# Patient Record
Sex: Female | Born: 2000 | Race: White | Hispanic: No | Smoking: Never smoker
Health system: Southern US, Community
[De-identification: ages and names within clinical notes are randomized; demographics above are authoritative.]

## PROBLEM LIST (undated history)

## (undated) DIAGNOSIS — K589 Irritable bowel syndrome without diarrhea: Secondary | ICD-10-CM

## (undated) DIAGNOSIS — F419 Anxiety disorder, unspecified: Secondary | ICD-10-CM

## (undated) DIAGNOSIS — F32A Depression, unspecified: Secondary | ICD-10-CM

## (undated) DIAGNOSIS — G43909 Migraine, unspecified, not intractable, without status migrainosus: Secondary | ICD-10-CM

## (undated) HISTORY — DX: Depression, unspecified: F32.A

## (undated) HISTORY — DX: Anxiety disorder, unspecified: F41.9

---
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
## 2022-04-25 IMAGING — CT CT HEAD W/O CM
3 series · 15 of 46 positions shown, 18 images · non-contrast
Comparison: None.

CLINICAL DATA: Hit head against car door

EXAM:
CT HEAD WITHOUT CONTRAST
TECHNIQUE: Contiguous axial images were obtained from the base of the skull
through the vertex without intravenous contrast.

[Series 2: head wo · axial · 0.45mm/px · z∈[-215,-95]mm · 9 of 29 slices shown, 12 images]
[im 3/29  brain]
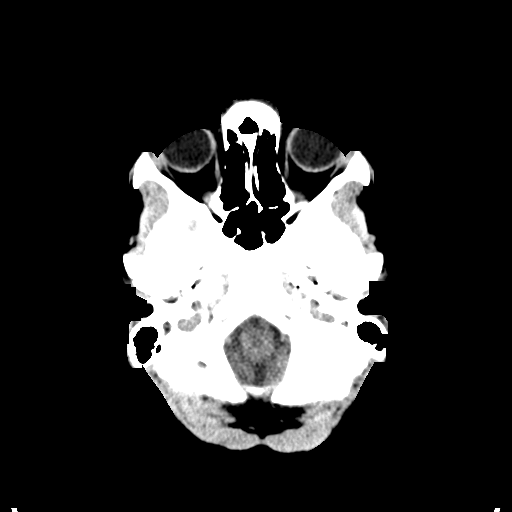
[im 3/29  bone]
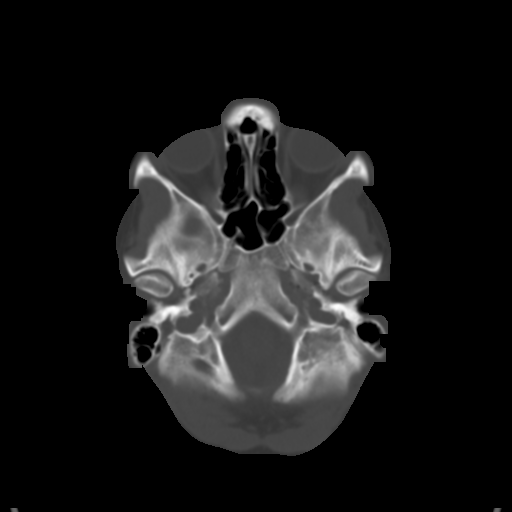
[im 6/29  brain]
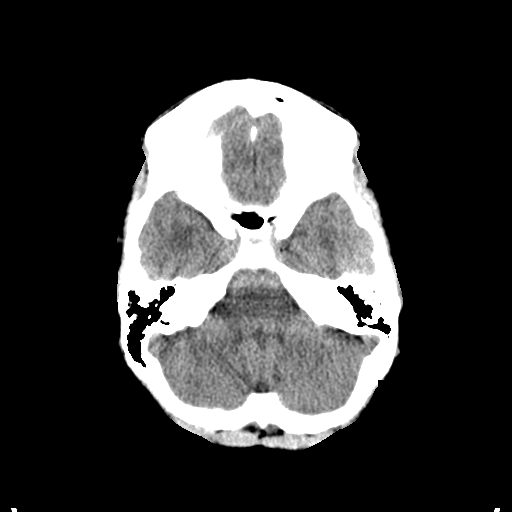
[im 9/29  brain]
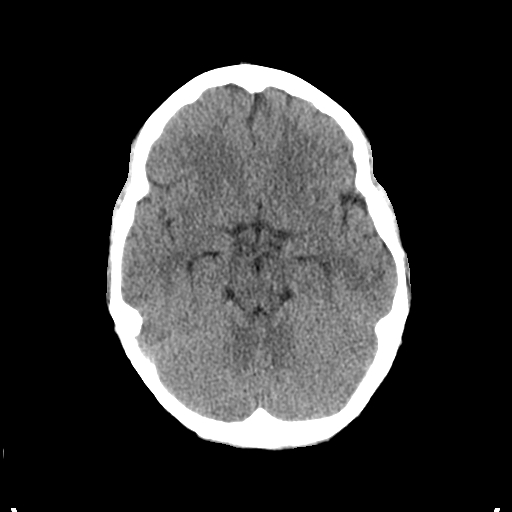
[im 12/29  brain]
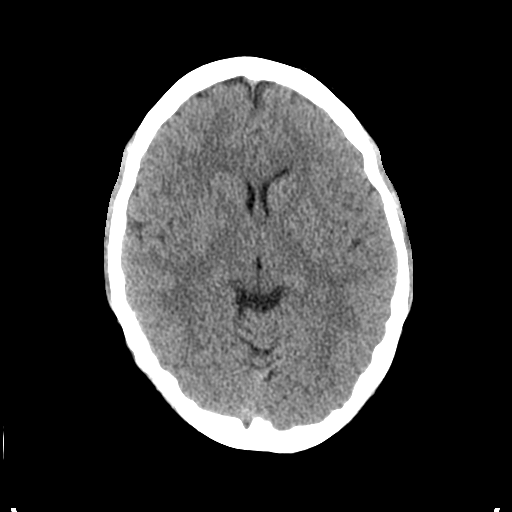
[im 15/29  brain]
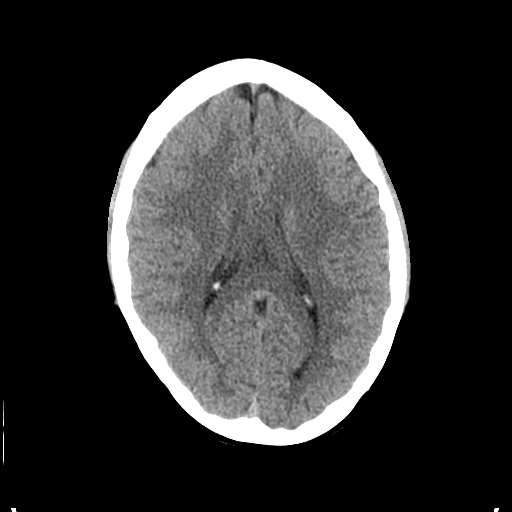
[im 15/29  bone]
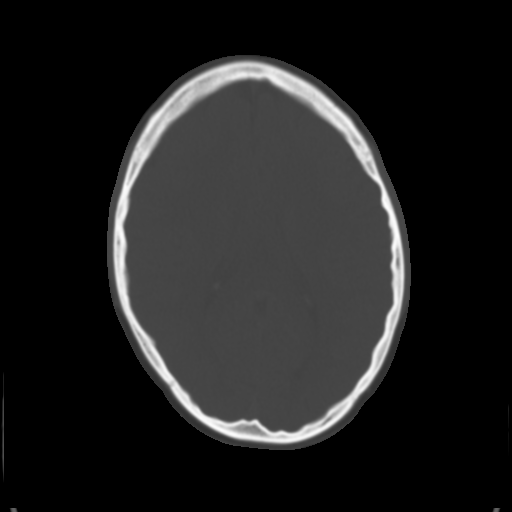
[im 18/29  brain]
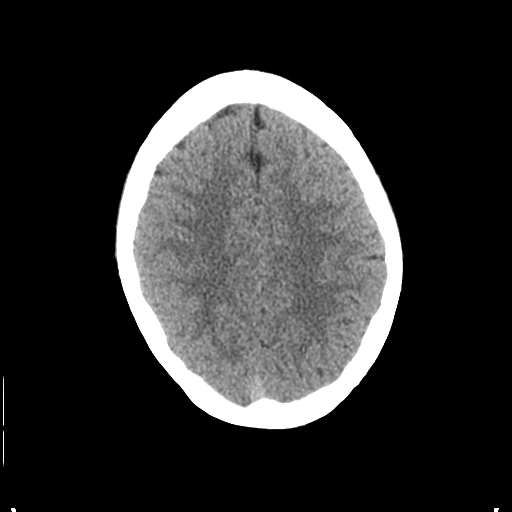
[im 21/29  brain]
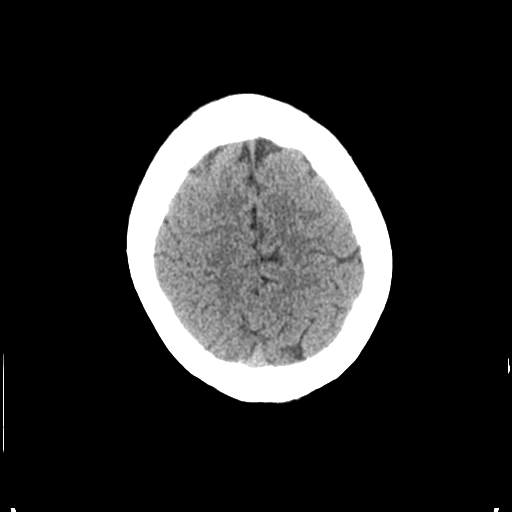
[im 24/29  brain]
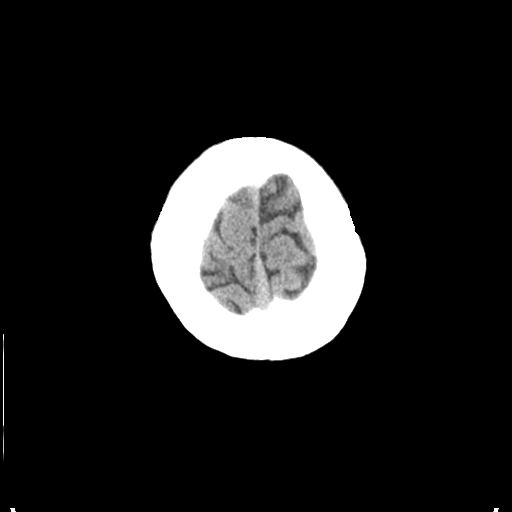
[im 27/29  brain]
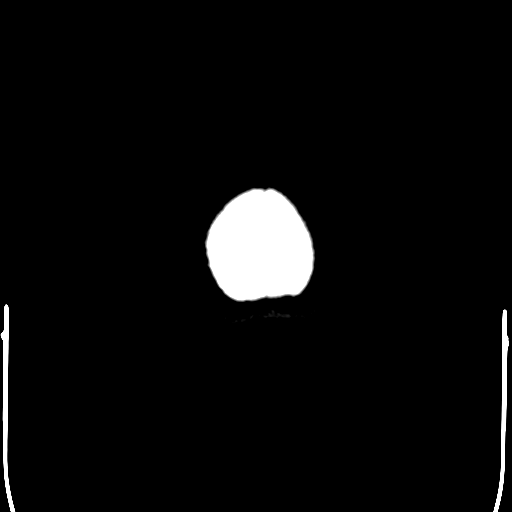
[im 27/29  bone]
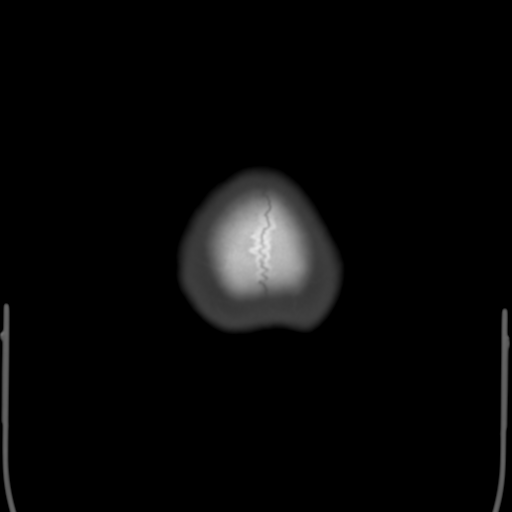

[Series 4: coronal soft tissue · coronal · 0.27mm/px · 3 of 65 slices shown]
[im 22/65  brain]
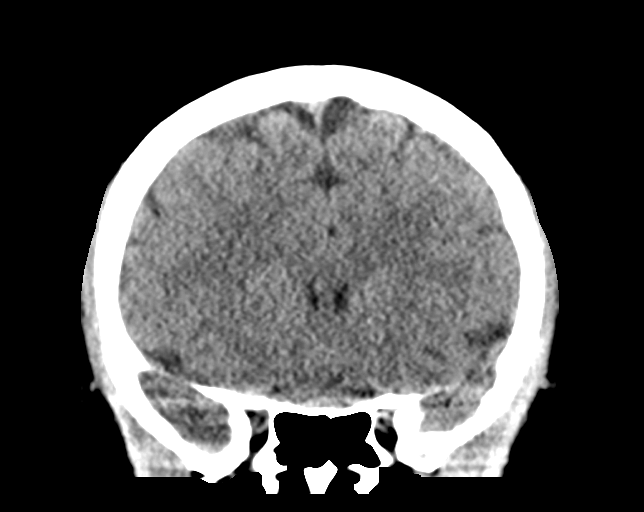
[im 29/65  brain]
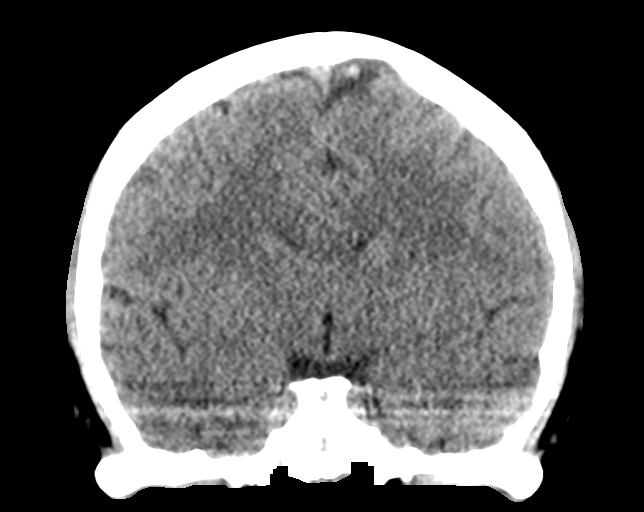
[im 36/65  brain]
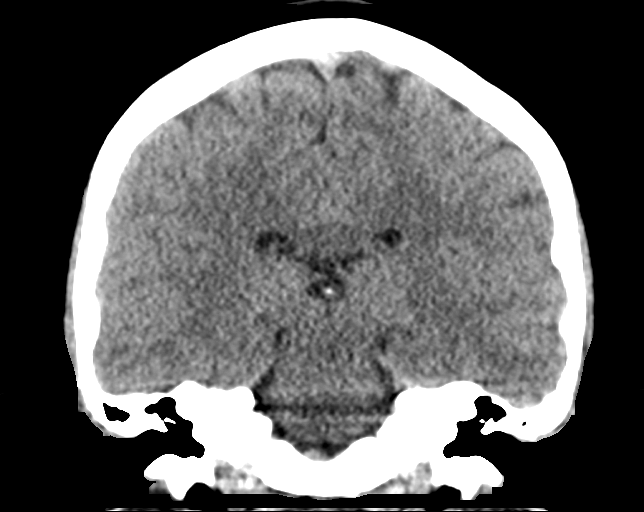

[Series 5: sagittal soft tissue · sagittal · 0.27mm/px · 3 of 58 slices shown]
[im 20/58  brain]
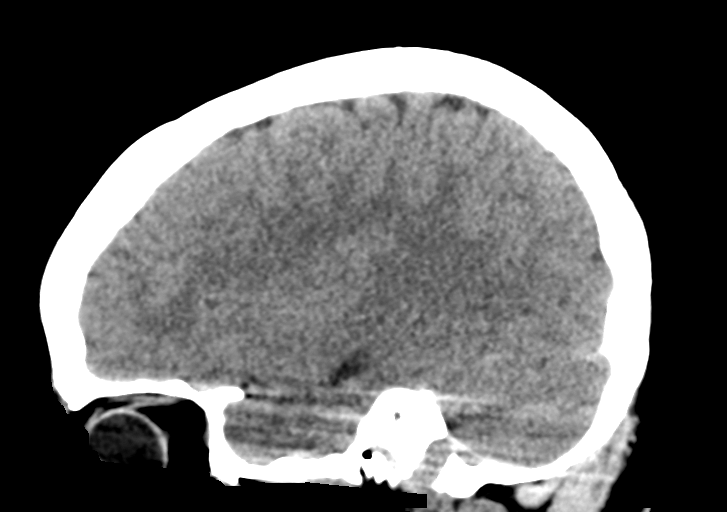
[im 29/58  brain]
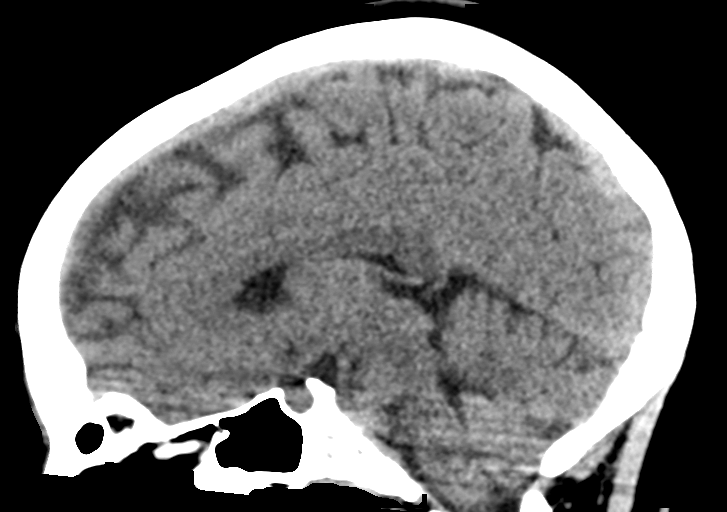
[im 39/58  brain]
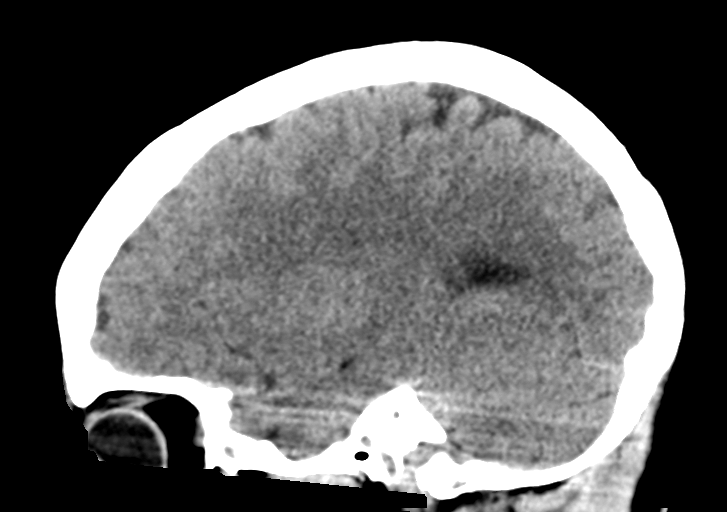

[15 of 46 positions shown; findings below may reference images not displayed]

FINDINGS: Brain: Ventricles and sulci are normal in size and configuration.
There is no intracranial mass, hemorrhage, extra-axial fluid
collection, or midline shift. Brain parenchyma appears unremarkable.
No acute infarct evident.

Vascular: No hyperdense vessel.  No evident vascular calcification.

Skull: Bony calvarium appears intact.

Sinuses/Orbits: Visualized paranasal sinuses are clear. Visualized
orbits appear symmetric bilaterally.

Other: Mastoid air cells are clear.
IMPRESSION: Study within normal limits.

## 2022-05-03 ENCOUNTER — Ambulatory Visit
Admission: EM | Admit: 2022-05-03 | Discharge: 2022-05-03 | Disposition: A | Discharge status: Home / Self Care | Payer: BC Managed Care – PPO

## 2022-05-03 ENCOUNTER — Encounter: Payer: Self-pay | Admitting: Emergency Medicine

## 2022-05-03 DIAGNOSIS — G5601 Carpal tunnel syndrome, right upper limb: Secondary | ICD-10-CM

## 2022-05-03 MED ORDER — KETOROLAC TROMETHAMINE 30 MG/ML IJ SOLN
30.0000 mg | Freq: Once | INTRAMUSCULAR | Status: AC
  Administered 2022-05-03: 30 mg via INTRAMUSCULAR

## 2022-05-03 MED ORDER — CYCLOBENZAPRINE HCL 10 MG PO TABS: 10.0000 mg | ORAL_TABLET | Freq: Every day | ORAL | 0 refills | Status: DC

## 2022-05-03 MED ORDER — PREDNISONE 20 MG PO TABS: 40.0000 mg | ORAL_TABLET | Freq: Every day | ORAL | 0 refills | Status: DC

## 2022-07-01 ENCOUNTER — Ambulatory Visit
Admission: EM | Admit: 2022-07-01 | Discharge: 2022-07-01 | Disposition: A | Discharge status: Home / Self Care | Payer: Medicaid Other | Attending: Emergency Medicine | Admitting: Emergency Medicine

## 2022-07-01 DIAGNOSIS — R300 Dysuria: Secondary | ICD-10-CM | POA: Diagnosis not present

## 2022-07-01 DIAGNOSIS — J029 Acute pharyngitis, unspecified: Secondary | ICD-10-CM | POA: Diagnosis not present

## 2022-07-01 DIAGNOSIS — R1030 Lower abdominal pain, unspecified: Secondary | ICD-10-CM | POA: Insufficient documentation

## 2022-07-01 DIAGNOSIS — B349 Viral infection, unspecified: Secondary | ICD-10-CM | POA: Insufficient documentation

## 2022-07-01 DIAGNOSIS — R197 Diarrhea, unspecified: Secondary | ICD-10-CM | POA: Diagnosis not present

## 2022-07-01 DIAGNOSIS — Z20822 Contact with and (suspected) exposure to covid-19: Secondary | ICD-10-CM | POA: Insufficient documentation

## 2022-07-01 DIAGNOSIS — Z79899 Other long term (current) drug therapy: Secondary | ICD-10-CM | POA: Insufficient documentation

## 2022-07-01 LAB — POCT URINALYSIS DIP (MANUAL ENTRY)
Bilirubin, UA: NEGATIVE
Blood, UA: NEGATIVE
Glucose, UA: NEGATIVE mg/dL
Ketones, POC UA: NEGATIVE mg/dL
Nitrite, UA: NEGATIVE
Protein Ur, POC: NEGATIVE mg/dL
Spec Grav, UA: 1.02 (ref 1.010–1.025)
Urobilinogen, UA: 0.2 E.U./dL
pH, UA: 8 (ref 5.0–8.0)

## 2022-07-01 LAB — POCT RAPID STREP A (OFFICE): Rapid Strep A Screen: NEGATIVE

## 2022-07-01 LAB — POCT URINE PREGNANCY: Preg Test, Ur: NEGATIVE

## 2022-07-01 MED ORDER — LIDOCAINE VISCOUS HCL 2 % MT SOLN: 15.0000 mL | OROMUCOSAL | 0 refills | Status: DC | PRN

## 2022-07-01 NOTE — ED Provider Notes (Signed)
Andrea Mccarthy    CSN: [id number] Arrival date & time: 07/01/22  1204      History   Chief Complaint Chief Complaint  Patient presents with   Sore Throat    HPI Andrea Mccarthy is a 21 y.o. female.  Patient presents with 1 day history of chills, sore throat, congestion, cough, diarrhea.  She also reports dysuria and lower abdominal cramping.  No diarrhea today.  She denies fever, rash, chest pain, shortness of breath, vomiting, vaginal discharge, hematuria, pelvic pain, or other symptoms.  No OTC medications taken today.  Her medical history includes migraine headaches, IBS, depression, anxiety, gender dysphoria.  The history is provided by the patient and medical records.    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 No pertinent surgical history.  OB History   No obstetric history on file.      Home Medications    Prior to Admission medications   Medication Sig Start Date End Date Taking? Authorizing Provider  lidocaine (XYLOCAINE) 2 % solution Use as directed 15 mLs in the mouth or throat as needed for mouth pain. 07/01/22  Yes Mickie Bail, NP  amitriptyline (ELAVIL) 50 MG tablet Take by mouth. 03/13/20 03/13/21  [provider]  cyclobenzaprine (FLEXERIL) 10 MG tablet Take 1 tablet (10 mg total) by mouth at bedtime. 05/03/22   Valinda Hoar, NP  famotidine (PEPCID) 40 MG tablet Take 1 tablet (40 mg total) by mouth daily for 10 days. 10/04/20 10/14/20  Orma Flaming, NP  hydrOXYzine (ATARAX/VISTARIL) 50 MG tablet Take 1 tablet (50 mg total) by mouth at bedtime. 09/23/19   Darcel Smalling, MD  levonorgestrel (MIRENA) 20 MCG/DAY IUD 1 each by Intrauterine  route once.    [provider]  ondansetron (ZOFRAN) 8 MG tablet Take 1 tablet (8 mg total) by mouth every 8 (eight) hours as needed for nausea or vomiting. 12/28/20   Joni Reining, PA-C  predniSONE (DELTASONE) 20 MG tablet Take 2 tablets (40 mg total) by mouth daily. 05/03/22   Valinda Hoar, NP  sertraline (ZOLOFT) 50 MG tablet Take 1.5 tablets (75 mg total) by mouth daily. 09/23/19 11/22/19  Darcel Smalling, MD  SUMAtriptan (IMITREX) 50 MG tablet Take by mouth. 10/27/19 10/26/20  [provider]  traZODone (DESYREL) 50 MG tablet Take 0.5-1 tablets (25-50 mg total) by mouth at bedtime as needed for sleep. 09/23/19   Darcel Smalling, MD    Family History Family History  Family history unknown: Yes    Social History Social History   Tobacco Use   Smoking status: Never   Smokeless tobacco: Never  Vaping Use   Vaping Use: Never used  Substance Use Topics   Alcohol use: No   Drug use: Never     Allergies   Patient has no known allergies.   Review of Systems Review of Systems  Constitutional:  Positive for chills. Negative for fever.  HENT:  Positive for congestion and sore throat. Negative for ear pain.   Respiratory:  Positive for cough. Negative for shortness of breath.   Cardiovascular:  Negative for chest pain and palpitations.  Gastrointestinal:  Positive for abdominal  pain and diarrhea. Negative for vomiting.  Genitourinary:  Positive for dysuria. Negative for flank pain, hematuria, pelvic pain and vaginal discharge.  Skin:  Negative for color change and rash.  All other systems reviewed and are negative.    Physical Exam Triage Vital Signs ED Triage Vitals  Enc Vitals Group     BP 07/01/22 1235 123/80     Pulse Rate 07/01/22 1235 (!) 104     Resp 07/01/22 1235 18     Temp 07/01/22 1235 98.4 F (36.9 C)     Temp src --      SpO2 07/01/22 1235 97 %     Weight 07/01/22 1239 127 lb (57.6 kg)     Height 07/01/22 1239 4\' 11"  (1.499 m)      Head Circumference --      Peak Flow --      Pain Score 07/01/22 1239 8     Pain Loc --      Pain Edu? --      Excl. in GC? --    No data found.  Updated Vital Signs BP 123/80   Pulse (!) 104   Temp 98.4 F (36.9 C)   Resp 18   Ht 4\' 11"  (1.499 m)   Wt 127 lb (57.6 kg)   SpO2 97%   BMI 25.65 kg/m   Visual Acuity Right Eye Distance:   Left Eye Distance:   Bilateral Distance:    Right Eye Near:   Left Eye Near:    Bilateral Near:     Physical Exam Vitals and nursing note reviewed.  Constitutional:      General: She is not in acute distress.    Appearance: Normal appearance. She is well-developed. She is not ill-appearing.  HENT:     Right Ear: Tympanic membrane normal.     Left Ear: Tympanic membrane normal.     Nose: Nose normal.     Mouth/Throat:     Mouth: Mucous membranes are moist.     Pharynx: Oropharynx is clear.  Cardiovascular:     Rate and Rhythm: Normal rate and regular rhythm.     Heart sounds: Normal heart sounds.  Pulmonary:     Effort: Pulmonary effort is normal. No respiratory distress.     Breath sounds: Normal breath sounds.  Abdominal:     General: Bowel sounds are normal.     Palpations: Abdomen is soft.     Tenderness: There is no abdominal tenderness. There is no right CVA tenderness, left CVA tenderness, guarding or rebound.  Musculoskeletal:     Cervical back: Neck supple.  Skin:    General: Skin is warm and dry.  Neurological:     Mental Status: She is alert.  Psychiatric:        Mood and Affect: Mood normal.        Behavior: Behavior normal.      UC Treatments / Results  Labs (all labs ordered are listed, but only abnormal results are displayed) Labs Reviewed  POCT URINALYSIS DIP (MANUAL ENTRY) - Abnormal; Notable for the following components:      Result Value   Clarity, UA cloudy (*)    Leukocytes, UA Trace (*)    All other components within normal limits  URINE CULTURE  SARS CORONAVIRUS 2 (TAT 6-24 HRS)  POCT RAPID  STREP A (OFFICE)  POCT URINE PREGNANCY    EKG   Radiology No results found.  Procedures Procedures (including critical care time)  Medications Ordered  in UC Medications - No data to display  Initial Impression / Assessment and Plan / UC Course  I have reviewed the triage vital signs and the nursing notes.  Pertinent labs & imaging results that were available during my care of the patient were reviewed by me and considered in my medical decision making (see chart for details).    Dysuria, viral illness, sore throat, diarrhea, lower abdominal pain.  Rapid strep negative.  COVID pending.  Urine culture pending.  Treating sore throat with viscous lidocaine.  Discussed other symptomatic treatment including Tylenol or ibuprofen as needed.  Instructed patient to follow-up with her PCP if her symptoms are not improving.  ED precautions discussed.  Education provided on dysuria, viral illness, sore throat, diarrhea, abdominal pain.  Patient agrees to plan of care.  Final Clinical Impressions(s) / UC Diagnoses   Final diagnoses:  Dysuria  Viral illness  Sore throat  Diarrhea, unspecified type  Lower abdominal pain     Discharge Instructions      The strep test is negative.  COVID is pending.    Use the viscous lidocaine as directed.  Take Tylenol or ibuprofen as directed.   Urine culture is pending.    Follow up with your primary care provider if your symptoms are not improving.        ED Prescriptions     Medication Sig Dispense Auth. Provider   lidocaine (XYLOCAINE) 2 % solution Use as directed 15 mLs in the mouth or throat as needed for mouth pain. 100 mL Mickie Bail, NP      PDMP not reviewed this encounter.   Mickie Bail, NP 07/01/22 [phone]

## 2022-07-01 NOTE — Discharge Instructions (Addendum)
The strep test is negative.  COVID is pending.    Use the viscous lidocaine as directed.  Take Tylenol or ibuprofen as directed.   Urine culture is pending.    Follow up with your primary care provider if your symptoms are not improving.

## 2022-07-01 NOTE — ED Triage Notes (Addendum)
Patient to Urgent Care with complaints of sore throat and congestion x1 day. Also reports lower abdominal cramping/ sharp pains. Denies NV but endorses some diarrhea. Some burning with urination.   Denies any known sick contacts but does work at [employer].

## 2022-07-02 LAB — URINE CULTURE

## 2022-07-02 LAB — SARS CORONAVIRUS 2 (TAT 6-24 HRS): SARS Coronavirus 2: NEGATIVE

## 2022-07-04 DIAGNOSIS — Z719 Counseling, unspecified: Secondary | ICD-10-CM | POA: Diagnosis not present

## 2022-07-04 DIAGNOSIS — F418 Other specified anxiety disorders: Secondary | ICD-10-CM | POA: Diagnosis not present

## 2022-07-04 DIAGNOSIS — R197 Diarrhea, unspecified: Secondary | ICD-10-CM | POA: Diagnosis not present

## 2022-07-04 DIAGNOSIS — J069 Acute upper respiratory infection, unspecified: Secondary | ICD-10-CM | POA: Diagnosis not present

## 2022-12-03 ENCOUNTER — Inpatient Hospital Stay (HOSPITAL_COMMUNITY)
Admission: AD | Admit: 2022-12-03 | Discharge: 2022-12-10 | DRG: 885 | Disposition: A | Discharge status: Home / Self Care | Payer: Medicaid Other | Source: Intra-hospital | Attending: Psychiatry | Admitting: Psychiatry

## 2022-12-03 ENCOUNTER — Ambulatory Visit (HOSPITAL_COMMUNITY)
Admission: EM | Admit: 2022-12-03 | Discharge: 2022-12-03 | Disposition: A | Discharge status: Other Acute Inpatient Hospital | Payer: Medicaid Other | Attending: Psychiatry | Admitting: Psychiatry

## 2022-12-03 DIAGNOSIS — K589 Irritable bowel syndrome without diarrhea: Secondary | ICD-10-CM | POA: Diagnosis present

## 2022-12-03 DIAGNOSIS — Z1152 Encounter for screening for COVID-19: Secondary | ICD-10-CM | POA: Diagnosis not present

## 2022-12-03 DIAGNOSIS — F22 Delusional disorders: Secondary | ICD-10-CM | POA: Insufficient documentation

## 2022-12-03 DIAGNOSIS — R42 Dizziness and giddiness: Secondary | ICD-10-CM | POA: Diagnosis not present

## 2022-12-03 DIAGNOSIS — Z6372 Alcoholism and drug addiction in family: Secondary | ICD-10-CM

## 2022-12-03 DIAGNOSIS — Z9151 Personal history of suicidal behavior: Secondary | ICD-10-CM | POA: Diagnosis not present

## 2022-12-03 DIAGNOSIS — F332 Major depressive disorder, recurrent severe without psychotic features: Secondary | ICD-10-CM | POA: Diagnosis not present

## 2022-12-03 DIAGNOSIS — Z9152 Personal history of nonsuicidal self-harm: Secondary | ICD-10-CM

## 2022-12-03 DIAGNOSIS — F419 Anxiety disorder, unspecified: Secondary | ICD-10-CM | POA: Diagnosis present

## 2022-12-03 DIAGNOSIS — Z79899 Other long term (current) drug therapy: Secondary | ICD-10-CM

## 2022-12-03 DIAGNOSIS — W1830XA Fall on same level, unspecified, initial encounter: Secondary | ICD-10-CM | POA: Diagnosis not present

## 2022-12-03 DIAGNOSIS — I959 Hypotension, unspecified: Secondary | ICD-10-CM | POA: Diagnosis not present

## 2022-12-03 DIAGNOSIS — Z62810 Personal history of physical and sexual abuse in childhood: Secondary | ICD-10-CM

## 2022-12-03 DIAGNOSIS — K5900 Constipation, unspecified: Secondary | ICD-10-CM | POA: Diagnosis present

## 2022-12-03 DIAGNOSIS — Y92239 Unspecified place in hospital as the place of occurrence of the external cause: Secondary | ICD-10-CM | POA: Diagnosis not present

## 2022-12-03 DIAGNOSIS — F515 Nightmare disorder: Secondary | ICD-10-CM | POA: Diagnosis present

## 2022-12-03 DIAGNOSIS — Z811 Family history of alcohol abuse and dependence: Secondary | ICD-10-CM

## 2022-12-03 DIAGNOSIS — R45851 Suicidal ideations: Secondary | ICD-10-CM

## 2022-12-03 LAB — COMPREHENSIVE METABOLIC PANEL
ALT: 18 U/L (ref 0–44)
AST: 21 U/L (ref 15–41)
Albumin: 4.4 g/dL (ref 3.5–5.0)
Alkaline Phosphatase: 59 U/L (ref 38–126)
Anion gap: 10 (ref 5–15)
BUN: 6 mg/dL (ref 6–20)
CO2: 26 mmol/L (ref 22–32)
Calcium: 9.5 mg/dL (ref 8.9–10.3)
Chloride: 103 mmol/L (ref 98–111)
Creatinine, Ser: 0.75 mg/dL (ref 0.44–1.00)
GFR, Estimated: >60 mL/min (ref >60)
Glucose, Bld: 74 mg/dL (ref 70–99)
Potassium: 3.7 mmol/L (ref 3.5–5.1)
Sodium: 139 mmol/L (ref 135–145)
Total Bilirubin: 0.5 mg/dL (ref 0.3–1.2)
Total Protein: 7.5 g/dL (ref 6.5–8.1)

## 2022-12-03 LAB — URINALYSIS, COMPLETE (UACMP) WITH MICROSCOPIC
Bilirubin Urine: NEGATIVE
Glucose, UA: NEGATIVE mg/dL
Hgb urine dipstick: NEGATIVE
Ketones, ur: NEGATIVE mg/dL
Leukocytes,Ua: NEGATIVE
Nitrite: NEGATIVE
Protein, ur: NEGATIVE mg/dL
Specific Gravity, Urine: 1.02 (ref 1.005–1.030)
pH: 6 (ref 5.0–8.0)

## 2022-12-03 LAB — LIPID PANEL
Cholesterol: 168 mg/dL (ref 0–200)
HDL: 66 mg/dL (ref >40)
LDL Cholesterol: 95 mg/dL (ref 0–99)
Total CHOL/HDL Ratio: 2.5 RATIO
Triglycerides: 33 mg/dL (ref <150)
VLDL: 7 mg/dL (ref 0–40)

## 2022-12-03 LAB — CBC WITH DIFFERENTIAL/PLATELET
Abs Immature Granulocytes: 0.01 10*3/uL (ref 0.00–0.07)
Basophils Absolute: 0 10*3/uL (ref 0.0–0.1)
Basophils Relative: 1 %
Eosinophils Absolute: 0 10*3/uL (ref 0.0–0.5)
Eosinophils Relative: 1 %
HCT: 39.3 % (ref 36.0–46.0)
Hemoglobin: 13.5 g/dL (ref 12.0–15.0)
Immature Granulocytes: 0 %
Lymphocytes Relative: 25 %
Lymphs Abs: 1.6 10*3/uL (ref 0.7–4.0)
MCH: 31.5 pg (ref 26.0–34.0)
MCHC: 34.4 g/dL (ref 30.0–36.0)
MCV: 91.6 fL (ref 80.0–100.0)
Monocytes Absolute: 0.4 10*3/uL (ref 0.1–1.0)
Monocytes Relative: 7 %
Neutro Abs: 4.3 10*3/uL (ref 1.7–7.7)
Neutrophils Relative %: 66 %
Platelets: 308 10*3/uL (ref 150–400)
RBC: 4.29 MIL/uL (ref 3.87–5.11)
RDW: 12.7 % (ref 11.5–15.5)
WBC: 6.5 10*3/uL (ref 4.0–10.5)
nRBC: 0 % (ref 0.0–0.2)

## 2022-12-03 LAB — POC SARS CORONAVIRUS 2 AG: SARSCOV2ONAVIRUS 2 AG: NEGATIVE

## 2022-12-03 LAB — POCT URINE DRUG SCREEN - MANUAL ENTRY (I-SCREEN)
POC Amphetamine UR: NOT DETECTED
POC Buprenorphine (BUP): NOT DETECTED
POC Cocaine UR: NOT DETECTED
POC Marijuana UR: NOT DETECTED
POC Methadone UR: NOT DETECTED
POC Methamphetamine UR: NOT DETECTED
POC Morphine: NOT DETECTED
POC Oxazepam (BZO): NOT DETECTED
POC Oxycodone UR: NOT DETECTED
POC Secobarbital (BAR): NOT DETECTED

## 2022-12-03 LAB — RESP PANEL BY RT-PCR (RSV, FLU A&B, COVID)  RVPGX2
Influenza A by PCR: NEGATIVE
Influenza B by PCR: NEGATIVE
Resp Syncytial Virus by PCR: NEGATIVE
SARS Coronavirus 2 by RT PCR: NEGATIVE

## 2022-12-03 LAB — ETHANOL: Alcohol, Ethyl (B): <10 mg/dL (ref <10)

## 2022-12-03 LAB — HEMOGLOBIN A1C
Hgb A1c MFr Bld: 4.9 % (ref 4.8–5.6)
Mean Plasma Glucose: 93.93 mg/dL

## 2022-12-03 LAB — MAGNESIUM: Magnesium: 2.1 mg/dL (ref 1.7–2.4)

## 2022-12-03 LAB — POC URINE PREG, ED: Preg Test, Ur: NEGATIVE

## 2022-12-03 LAB — TSH: TSH: 1.283 u[IU]/mL (ref 0.350–4.500)

## 2022-12-03 MED ORDER — SERTRALINE HCL 50 MG PO TABS
50.0000 mg | ORAL_TABLET | Freq: Every day | ORAL | Status: DC
  Administered 2022-12-04: 50 mg via ORAL
  Filled 2022-12-03 (×4): qty 1

## 2022-12-03 MED ORDER — MAGNESIUM HYDROXIDE 400 MG/5ML PO SUSP: 30.0000 mL | Freq: Every day | ORAL | Status: DC | PRN

## 2022-12-03 MED ORDER — ALUM & MAG HYDROXIDE-SIMETH 200-200-20 MG/5ML PO SUSP: 30.0000 mL | ORAL | Status: DC | PRN

## 2022-12-03 MED ORDER — HYDROXYZINE HCL 25 MG PO TABS: 25.0000 mg | ORAL_TABLET | Freq: Three times a day (TID) | ORAL | 0 refills | Status: DC | PRN

## 2022-12-03 MED ORDER — HYDROXYZINE HCL 25 MG PO TABS
25.0000 mg | ORAL_TABLET | Freq: Three times a day (TID) | ORAL | Status: DC | PRN
  Filled 2022-12-03: qty 1

## 2022-12-03 MED ORDER — DIPHENHYDRAMINE HCL 25 MG PO CAPS
25.0000 mg | ORAL_CAPSULE | Freq: Once | ORAL | Status: DC
  Filled 2022-12-03: qty 1

## 2022-12-03 MED ORDER — HYDROXYZINE HCL 25 MG PO TABS: 25.0000 mg | ORAL_TABLET | Freq: Three times a day (TID) | ORAL | Status: DC | PRN

## 2022-12-03 MED ORDER — TRAZODONE HCL 50 MG PO TABS: 50.0000 mg | ORAL_TABLET | Freq: Every evening | ORAL | Status: DC | PRN

## 2022-12-03 MED ORDER — SERTRALINE HCL 50 MG PO TABS: 50.0000 mg | ORAL_TABLET | Freq: Every day | ORAL | Status: DC

## 2022-12-03 MED ORDER — ACETAMINOPHEN 325 MG PO TABS
650.0000 mg | ORAL_TABLET | Freq: Four times a day (QID) | ORAL | Status: DC | PRN
  Administered 2022-12-03: 650 mg via ORAL
  Filled 2022-12-03: qty 2

## 2022-12-03 MED ORDER — ACETAMINOPHEN 325 MG PO TABS: 650.0000 mg | ORAL_TABLET | Freq: Four times a day (QID) | ORAL | Status: DC | PRN

## 2022-12-03 MED ORDER — MAGNESIUM HYDROXIDE 400 MG/5ML PO SUSP
30.0000 mL | Freq: Every day | ORAL | Status: DC | PRN
  Administered 2022-12-08: 30 mL via ORAL
  Filled 2022-12-03: qty 30

## 2022-12-03 MED ORDER — SERTRALINE HCL 50 MG PO TABS
50.0000 mg | ORAL_TABLET | Freq: Every day | ORAL | Status: DC
  Administered 2022-12-03: 50 mg via ORAL
  Filled 2022-12-03: qty 1

## 2022-12-03 NOTE — ED Notes (Signed)
[facility] lab courier called to transport labs to [facility] lab 

## 2022-12-03 NOTE — Progress Notes (Signed)
Pt was accepted to [facility] 12/03/22; Bed Assignment 301-2 PENDING Labs, COVID, and Voluntary Consent faxed to [phone]  Pt meets inpatient criteria per Thomes Lolling, NP  Attending Physician will be Dr. Caswell Corwin  Report can be called to: Adult unit: [phone]  Pt can arrive after: PENDING ITEMS  Care Team notified: [facility] Lynnda Shields, RN, Thomes Lolling, NP, Mardene Sayer, RN, Drema Halon, RN,  [facility] Wynonia Hazard, RN, [name], [place], [place] 12/03/2022 @ 6:09 PM   ,

## 2022-12-03 NOTE — ED Notes (Addendum)
Pt on obs until covid test results then being transported to [facility] . endorsing SI denies HI/AVH. Calm, cooperative throughout interview process. Skin assessment completed. Oriented to unit. Meal and drink offered. At [place], pt continue to endorse SI denies HI/AVH. Pt verbally contract for safety. Will monitor for safety.Patient belongs are in locker 88

## 2022-12-03 NOTE — ED Notes (Addendum)
Patient  sleeping in no acute stress. RR even and unlabored .Environment secured .Will continue to monitor for safely. 
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
## 2022-12-03 NOTE — ED Notes (Signed)
Pt resting at this hour. Easy to arouse. Pt endorses suicidal thoughts. Her plan was to OD on medications. "My friend has disposed of all the pills I had." Pt contracts for safety. Pt endorses high anxiety and depression. Pt being transferred to [facility] for treatment tonight. No apparent distress. RR even and unlabored. Monitored for safety.

## 2022-12-03 NOTE — ED Notes (Signed)
Called report to [employer] @ [phone]. States to have Next rn call covid results once they are back.

## 2022-12-03 NOTE — ED Provider Notes (Signed)
Behavioral Health Urgent Care Medical Screening Exam  Patient Name: Andrea Mccarthy MRN: [id number] Date of Evaluation: 12/03/22 Chief Complaint:  "I tried to kill myself twice this week". Diagnosis:  Final diagnoses:  Severe episode of recurrent major depressive disorder, without psychotic features ([place])  Suicidal ideation    History of Present illness: Andrea Mccarthy is a 22 y.o. female patient presented to [facility] as a walk in with her friend with complaints of, "I tried to kill myself twice this week".    Andrea Mccarthy, 22 y.o., female patient seen face to face by this provider and chart reviewed on 12/03/22.  Per chart review patient has past psychiatric history of MDD.  She currently does not take any medications and has no psychiatric services in place.  She does not remember any of the medication she has taken in the past but per chart review she has taken Elavil and Zoloft.  She denies any previous inpatient psychiatric admissions.  She denies any substance use.   On evaluation Andrea Mccarthy reports she has been depressed on and off for years.  However over the past few months she has noticed an increase in her depression and anxiety but within the last 2 weeks she feels as though it is become intense. Over the past week she has began having intrusive suicidal thoughts.   She had 2 suicide attempts this week by trying to cut herself.  She presents today stating, "I know that I need help and I am ready to get it".  Cannot identify any recent stressors/triggers.  During evaluation Andrea Mccarthy is observed sitting in the assessment room in no acute distress.  She is disheveled and makes fair eye contact.  She is alert/oriented x 4, cooperative, and attentive.  Her speech is clear, coherent, at a normal rate and tone.  She endorses an increase in anxiety and depression feelings of decreased concentration, fatigue, hopelessness, decrease in appetite, tearfulness.  She has a  dysphoric affect.  She reports her sleep is fair.  She is endorsing suicidal ideations with a plan to overdose.  She cannot contract for safety.  She denies HI/AVH.  She endorses some paranoia in social settings.  She does not appear manic or psychotic.  Objectively she does not appear to be responding to internal/external stimuli.  She is able to converse coherently with goal-directed thoughts and no distractibility.  Discussed inpatient psychiatric admission with patient and she is agreeable.  Discussed starting Zoloft 50 mg daily for depression/anxiety.  Educated on medication including adverse reactions/side effects.  Patient is agreeable  [place] ED from 12/03/2022 in [facility] ED from 07/01/2022 in [facility] at [facility]  ED from 05/03/2022 in [facility]gent [facility] at [place] Error: Question 6 not populated No Risk No Risk       Psychiatric Specialty Exam  Presentation  General Appearance:Disheveled  Eye Contact:Fair  Speech:Clear and Coherent; Normal Rate  Speech Volume:Normal  Handedness:Right   Mood and Affect  Mood:Anxious; Depressed  Affect:Congruent   Thought Process  Thought Processes:Coherent  Descriptions of Associations:Intact  Orientation:Full (Time, Place and Person)  Thought Content:Logical  Diagnosis of Schizophrenia or Schizoaffective disorder in past: No   Hallucinations:None  Ideas of Reference:None  Suicidal Thoughts:Yes, Active With Intent; With Plan; With Means to Carry Out  Homicidal Thoughts:No   Sensorium  Memory:Immediate Good; Recent Good; Remote Good  Judgment:Fair  Insight:Fair   Executive  Functions  Concentration:Good  Attention Span:Good  [place]  Language:Good   Psychomotor Activity  Psychomotor Activity:Normal   Assets  Assets:Communication Skills; Desire for Improvement; Financial Resources/Insurance;  Housing; Physical Health; Resilience; Social Support   Sleep  Sleep:Fair  Number of hours: No data recorded  Physical Exam: Physical Exam Vitals and nursing note reviewed.  Constitutional:      General: She is not in acute distress.    Appearance: Normal appearance.  HENT:     Head: Normocephalic.  Eyes:     General:        Right eye: No discharge.        Left eye: No discharge.     Conjunctiva/sclera: Conjunctivae normal.  Cardiovascular:     Rate and Rhythm: Normal rate.  Pulmonary:     Effort: Pulmonary effort is normal. No respiratory distress.  Musculoskeletal:        General: Normal range of motion.     Cervical back: Normal range of motion.  Skin:    Coloration: Skin is not jaundiced or pale.  Neurological:     Mental Status: She is alert and oriented to person, place, and time.  Psychiatric:        Attention and Perception: Attention and perception normal.        Mood and Affect: Affect normal. Mood is anxious and depressed.        Speech: Speech normal.        Behavior: Behavior normal. Behavior is cooperative.        Thought Content: Thought content includes suicidal ideation. Thought content includes suicidal plan.        Cognition and Memory: Cognition normal.        Judgment: Judgment is impulsive.    Review of Systems  Constitutional: Negative.   HENT: Negative.    Eyes: Negative.   Respiratory: Negative.    Cardiovascular: Negative.   Musculoskeletal: Negative.   Skin: Negative.   Neurological: Negative.   Psychiatric/Behavioral:  Positive for depression and suicidal ideas. The patient is nervous/anxious.    Blood pressure 135/81, pulse 93, temperature 98.2 F (36.8 C), temperature source Oral, resp. rate 18, SpO2 100 %. There is no height or weight on file to calculate BMI.  Musculoskeletal: Strength & Muscle Tone: within normal limits Gait & Station: normal Patient leans: N/A   [place] MSE Discharge Disposition for Follow up and  Recommendations: Based on my evaluation I certify that psychiatric inpatient services furnished can reasonably be expected to improve the patient's condition which I recommend transfer to an appropriate accepting facility.   Patient meets criteria for inpatient psychiatric admission.  [facility] [place] [facility] notified and patient has been accepted.  She can arrive 12/03/2022.  Zoloft 50 mg daily initiated for depression/anxiety  Lab Orders         Resp panel by RT-PCR (RSV, Flu A&B, Covid) Anterior Nasal Swab         CBC with Differential/Platelet         Comprehensive metabolic panel         Hemoglobin A1c         Magnesium         Ethanol         Lipid panel         TSH         RPR         Urinalysis, Complete w Microscopic Urine, Clean Catch  POC urine preg, ED         POCT Urine Drug Screen - (I-Screen)         POC SARS Coronavirus 2 Ag      EKG  Ardis Hughs, NP 12/03/2022, 6:37 PM

## 2022-12-03 NOTE — Discharge Instructions (Addendum)
Transfer to [facility] for IP admission  

## 2022-12-04 ENCOUNTER — Encounter (HOSPITAL_COMMUNITY): Payer: Self-pay | Admitting: Psychiatry

## 2022-12-04 ENCOUNTER — Other Ambulatory Visit: Payer: Self-pay

## 2022-12-04 ENCOUNTER — Encounter (HOSPITAL_COMMUNITY): Payer: Self-pay

## 2022-12-04 DIAGNOSIS — F332 Major depressive disorder, recurrent severe without psychotic features: Secondary | ICD-10-CM

## 2022-12-04 LAB — RPR: RPR Ser Ql: NONREACTIVE

## 2022-12-04 MED ORDER — HYDROXYZINE HCL 25 MG PO TABS
25.0000 mg | ORAL_TABLET | Freq: Three times a day (TID) | ORAL | Status: DC | PRN
  Administered 2022-12-04 – 2022-12-09 (×11): 25 mg via ORAL
  Filled 2022-12-04 (×11): qty 1

## 2022-12-04 MED ORDER — IBUPROFEN 200 MG PO TABS
200.0000 mg | ORAL_TABLET | Freq: Four times a day (QID) | ORAL | Status: DC | PRN
  Administered 2022-12-04 – 2022-12-07 (×4): 200 mg via ORAL
  Filled 2022-12-04 (×4): qty 1

## 2022-12-04 MED ORDER — ONDANSETRON HCL 4 MG PO TABS
4.0000 mg | ORAL_TABLET | Freq: Three times a day (TID) | ORAL | Status: DC | PRN
  Administered 2022-12-04 – 2022-12-05 (×2): 4 mg via ORAL
  Filled 2022-12-04 (×2): qty 1

## 2022-12-04 MED ORDER — HYDROXYZINE HCL 50 MG PO TABS
50.0000 mg | ORAL_TABLET | Freq: Every evening | ORAL | Status: DC | PRN
  Administered 2022-12-04 – 2022-12-06 (×3): 50 mg via ORAL
  Filled 2022-12-04 (×3): qty 1

## 2022-12-04 MED ORDER — MELATONIN 3 MG PO TABS
3.0000 mg | ORAL_TABLET | Freq: Every evening | ORAL | Status: DC | PRN
  Administered 2022-12-04: 3 mg via ORAL
  Filled 2022-12-04: qty 1

## 2022-12-04 MED ORDER — MIRTAZAPINE 15 MG PO TABS
15.0000 mg | ORAL_TABLET | Freq: Every day | ORAL | Status: DC
  Administered 2022-12-04 – 2022-12-09 (×6): 15 mg via ORAL
  Filled 2022-12-04 (×8): qty 1

## 2022-12-04 NOTE — Plan of Care (Signed)
  Problem: Education: Goal: Emotional status will improve Outcome: Not Progressing Goal: Mental status will improve Outcome: Not Progressing   

## 2022-12-04 NOTE — Progress Notes (Signed)
Patient ID: Andrea Mccarthy, female   DOB: 2001-03-18, 22 y.o.   MRN: [id number]  Andrea Mccarthy is a 22 year old female admitted voluntarily from [facility] with a plan to overdose 12/03/22 in her friend's car on Dayquil and Nyquil due to increasing depression. Patient is unable to list any stressors that have caused worsening depression at this time. Patient reports that she is employed at [employer] and has recently felt that she is unable to go to work, patient reports this is "unlike" her. Patient states that she has declined in her ADLs, had trouble concentrating, appetite decrease with weight loss of 9 lbs, difficulty concentrating, thoughts of SI with recent suicide attempts, self-harm, increased anxiety, depression and insomnia. Patient is passive for SI on admission and reports no plan/intent at this time. Patient rates anxiety and depression 10/10. Patient endorses pain 5/10. Patient denies HI, AVH and is able to verbally contract for safety. Patient reports a history of self-harm. Patient states, "I wear myself out worrying to the point I'm so tired". Patient identifies working as a Editor, commissioning of coping with increased anxiety and depression. Patient states that her current living arrangements are with a grandparent and identifies a positive support system. Patient states that she has no known allergies, however feels like benadryl, hydroxyzine and trazodone "wire her". Patient does not take any prescription or OTC medications at home. Patient UDS negative, patient states that she does not use tobacco, alcohol or any other substances. Skin assessment completed and revealed bruising of the right hand and fingers, closed/healing superficial cuts to the left wrist; patient states cuts are from self-harm, large bruise of the right forearm, diffuse bruising to both shins bilaterally and right big toe and pinky toe bruise/callus. Patient states that she is clumsy and gets bruised from work by running into things.  Patient states she is dizzy as her baseline. Patient educated on fall prevention and safety. Patient was oriented to the unit and educated on [facility] rules and procedures. Patient offered nourishment, patient declined meal at this time, but accepted fluids. Patient anxiety was discussed with NP and patient. Patient requested to take PRN hydroxyzine for anxiety and declined order for benadryl. PRN hydroxyzine and ibuprofen administered, per provider orders. Patient education provided, q 15 minute safety checks implemented, patient remains safe on the unit.

## 2022-12-04 NOTE — [facility] Group Notes (Signed)
Adult Psychoeducational Group Note  Date:  12/04/2022 Time:  10:30 AM  Group Topic/Focus:  Goals Group:   The focus of this group is to help patients establish daily goals to achieve during treatment and discuss how the patient can incorporate goal setting into their daily lives to aide in recovery.  Participation Level:  Active  Additional Comments:    There wasn't a formal group today due to staffing, but I was able to get a 1:1 goals review with the pt. The patient's goal was to be more social.   [name] 12/04/2022, 10:30 AM

## 2022-12-04 NOTE — Group Note (Signed)
Recreation Therapy Group Note   Group Topic:Problem Solving  Group Date: 12/04/2022 Start Time: 0930 End Time: 0950 Facilitators: Lea Baine-McCall, LRT,CTRS Location: [address]   Goal Area(s) Addresses:  Patient will effectively work with peer towards shared goal.  Patient will identify skill used to make activity successful.  Patient will identify how skills used during activity can be used to reach post d/c goals.   Group Description: Patient(s) were given a set of 10 solo cups, a rubber band, and some tied strings. The objective is to build a pyramid with the cups by only using the rubber band and string to move the cups. After the activity the patient(s) are LRT debriefed and discussed what strategies worked, what didn't, and what lessons they can take from the activity and use in life post discharge.    Affect/Mood: N/A   Participation Level: Did not attend    Clinical Observations/Individualized Feedback:      Plan: Continue to engage patient in RT group sessions 2-3x/week.   Redonna Wilbert-McCall, LRT,CTRS 12/04/2022 11:52 AM

## 2022-12-04 NOTE — Progress Notes (Addendum)
D: Pt denied SI/HI/AVH this morning. Pt rated her depression a 8/10 and her anxiety a 8/10. Pt complained of throwing up and having nausea this morning. Pt complained of a headache as well. Pt has been pleasant, calm, and cooperative throughout the shift.   A: RN provided support and encouragement to patient. Pt given scheduled medications as prescribed. PRN Ibuprofen and Zofran given for pain and nausea. Q15 min checks verified for safety.    R: Patient verbally contracts for safety. Patient compliant with medications and treatment plan. Patient is interacting well on the unit. Pt is safe on the unit.    12/04/22 1003  Psych Admission Type (Psych Patients Only)  Admission Status Voluntary  Psychosocial Assessment  Patient Complaints Anxiety;Depression;Self-harm thoughts  Eye Contact Fair  Facial Expression Sad;Anxious  Affect Anxious;Depressed  Speech Logical/coherent  Interaction Minimal  Motor Activity Other (Comment) (WDL)  Appearance/Hygiene Unremarkable  Behavior Characteristics Cooperative;Appropriate to situation  Mood Depressed;Anxious;Sad  Thought Process  Coherency WDL  Content WDL  Delusions None reported or observed  Perception WDL  Hallucination None reported or observed  Judgment Impaired  Confusion None  Danger to Self  Current suicidal ideation? Denies  Description of Suicide Plan No plan  Self-Injurious Behavior No self-injurious ideation or behavior indicators observed or expressed   Agreement Not to Harm Self Yes  Description of Agreement Pt verbally contracts for safety  Danger to Others  Danger to Others None reported or observed

## 2022-12-04 NOTE — Progress Notes (Signed)
12/04/22 2115  Psych Admission Type (Psych Patients Only)  Admission Status Voluntary  Psychosocial Assessment  Patient Complaints Anxiety;Depression;Self-harm thoughts  Eye Contact Fair  Facial Expression Sad;Anxious  Affect Depressed;Anxious  Speech Logical/coherent  Interaction Minimal  Motor Activity Other (Comment) (WDL)  Appearance/Hygiene Unremarkable  Behavior Characteristics Cooperative;Appropriate to situation  Mood Depressed;Anxious;Sad  Thought Process  Coherency WDL  Content WDL  Delusions None reported or observed  Perception WDL  Hallucination None reported or observed  Judgment Impaired  Confusion None  Danger to Self  Current suicidal ideation? Passive  Self-Injurious Behavior No self-injurious ideation or behavior indicators observed or expressed   Agreement Not to Harm Self Yes  Description of Agreement verbal  Danger to Others  Danger to Others None reported or observed
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
## 2022-12-04 NOTE — BH IP Treatment Plan (Signed)
Interdisciplinary Treatment and Diagnostic Plan Update  12/04/2022 Time of Session: 9:00am  Andrea Mccarthy MRN: [id number]  Principal Diagnosis: Severe episode of recurrent major depressive disorder, without psychotic features ([place])  Secondary Diagnoses: Principal Problem:   Severe episode of recurrent major depressive disorder, without psychotic features ([name]) Active Problems:   MDD (major depressive disorder), recurrent severe, without psychosis ([name])   Current Medications:  Current Facility-Administered Medications  Medication Dose Route Frequency Provider Last Rate Last Admin   alum & mag hydroxide-simeth (MAALOX/MYLANTA) 200-200-20 MG/5ML suspension 30 mL  30 mL Oral Q4H PRN Revonda Humphrey, NP       hydrOXYzine (ATARAX) tablet 25 mg  25 mg Oral TID PRN Onuoha, Chinwendu V, NP   25 mg at 12/04/22 0042   ibuprofen (ADVIL) tablet 200 mg  200 mg Oral Q6H PRN Onuoha, Chinwendu V, NP   200 mg at 12/04/22 1051   magnesium hydroxide (MILK OF MAGNESIA) suspension 30 mL  30 mL Oral Daily PRN Revonda Humphrey, NP       melatonin tablet 3 mg  3 mg Oral QHS PRN Onuoha, Chinwendu V, NP   3 mg at 12/04/22 0041   ondansetron (ZOFRAN) tablet 4 mg  4 mg Oral Q8H PRN Winfred Leeds, Nadir, MD   4 mg at 12/04/22 1051   sertraline (ZOLOFT) tablet 50 mg  50 mg Oral Daily Revonda Humphrey, NP   50 mg at 12/04/22 0756   PTA Medications: No medications prior to admission.    Patient Stressors: Occupational concerns   Other: Worrying, SI, self-harm    Patient Strengths: Average or above average intelligence  Communication skills  Physical Health  Supportive family/friends   Treatment Modalities: Medication Management, Group therapy, Case management,  1 to 1 session with clinician, Psychoeducation, Recreational therapy.   Physician Treatment Plan for Primary Diagnosis: Severe episode of recurrent major depressive disorder, without psychotic features ([name]) Long Term Goal(s):     Short Term  Goals:    Medication Management: Evaluate patient's response, side effects, and tolerance of medication regimen.  Therapeutic Interventions: 1 to 1 sessions, Unit Group sessions and Medication administration.  Evaluation of Outcomes: Not Met  Physician Treatment Plan for Secondary Diagnosis: Principal Problem:   Severe episode of recurrent major depressive disorder, without psychotic features ([name]) Active Problems:   MDD (major depressive disorder), recurrent severe, without psychosis ([place])  Long Term Goal(s):     Short Term Goals:       Medication Management: Evaluate patient's response, side effects, and tolerance of medication regimen.  Therapeutic Interventions: 1 to 1 sessions, Unit Group sessions and Medication administration.  Evaluation of Outcomes: Not Met   RN Treatment Plan for Primary Diagnosis: Severe episode of recurrent major depressive disorder, without psychotic features ([name]) Long Term Goal(s): Knowledge of disease and therapeutic regimen to maintain health will improve  Short Term Goals: Ability to remain free from injury will improve, Ability to participate in decision making will improve, Ability to verbalize feelings will improve, Ability to disclose and discuss suicidal ideas, and Ability to identify and develop effective coping behaviors will improve  Medication Management: RN will administer medications as ordered by provider, will assess and evaluate patient's response and provide education to patient for prescribed medication. RN will report any adverse and/or side effects to prescribing provider.  Therapeutic Interventions: 1 on 1 counseling sessions, Psychoeducation, Medication administration, Evaluate responses to treatment, Monitor vital signs and CBGs as ordered, Perform/monitor CIWA, COWS, AIMS and Fall Risk screenings as  ordered, Perform wound care treatments as ordered.  Evaluation of Outcomes: Not Met   LCSW Treatment Plan for Primary Diagnosis:  Severe episode of recurrent major depressive disorder, without psychotic features ([name]) Long Term Goal(s): Safe transition to appropriate next level of care at discharge, Engage patient in therapeutic group addressing interpersonal concerns.  Short Term Goals: Engage patient in aftercare planning with referrals and resources, Increase social support, Increase emotional regulation, Facilitate acceptance of mental health diagnosis and concerns, Identify triggers associated with mental health/substance abuse issues, and Increase skills for wellness and recovery  Therapeutic Interventions: Assess for all discharge needs, 1 to 1 time with Social worker, Explore available resources and support systems, Assess for adequacy in community support network, Educate family and significant other(s) on suicide prevention, Complete Psychosocial Assessment, Interpersonal group therapy.  Evaluation of Outcomes: Not Met   Progress in Treatment: Attending groups: Yes. Participating in groups: Yes. Taking medication as prescribed: Yes. Toleration medication: Yes. Family/Significant other contact made: Yes, individual(s) contacted:  Grandfather  Patient understands diagnosis: Yes. Discussing patient identified problems/goals with staff: Yes. Medical problems stabilized or resolved: Yes. Denies suicidal/homicidal ideation: Yes. Issues/concerns per patient self-inventory: No.   New problem(s) identified: No, Describe:  None   New Short Term/Long Term Goal(s): medication stabilization, elimination of SI thoughts, development of comprehensive mental wellness plan.   Patient Goals: "To work through not wanting to die"   Discharge Plan or Barriers: Patient recently admitted. CSW will continue to follow and assess for appropriate referrals and possible discharge planning.   Reason for Continuation of Hospitalization: Anxiety Depression Medication stabilization Suicidal ideation  Estimated Length of Stay: 3 to  7 days   Last [place] Suicide Severity Risk Score: [place] Admission (Current) from 12/03/2022 in [facility] Most recent reading at 12/03/2022 11:15 PM ED from 12/03/2022 in [facility] Most recent reading at 12/03/2022  6:06 PM ED from 07/01/2022 in [facility]  Most recent reading at 07/01/2022 12:42 PM  C-SSRS RISK CATEGORY High Risk Error: Question 6 not populated No Risk       Last PHQ 2/9 Scores:     No data to display          Scribe for Treatment Team: Carney Harder 12/04/2022 2:23 PM

## 2022-12-04 NOTE — BHH Suicide Risk Assessment (Signed)
[facility] Admission Suicide Risk Assessment   Nursing information obtained from:  Patient Demographic factors:  Adolescent or young adult, Caucasian, Gay, lesbian, or bisexual orientation Current Mental Status:  Self-harm thoughts, Self-harm behaviors, Suicidal ideation indicated by patient Loss Factors:  NA Historical Factors:  Prior suicide attempts Risk Reduction Factors:  Employed, Living with another person, especially a relative, Positive social support  Total Time spent with patient: 1 hour Principal Problem: MDD (major depressive disorder), recurrent severe, without psychosis ([place]) Diagnosis:  Principal Problem:   MDD (major depressive disorder), recurrent severe, without psychosis ([place])  Subjective Data: see H&P  Continued Clinical Symptoms:  Alcohol Use Disorder Identification Test Final Score (AUDIT): 1 The "Alcohol Use Disorders Identification Test", Guidelines for Use in Primary Care, Second Edition.  World Pharmacologist [facility]). Score between 0-7:  no or low risk or alcohol related problems. Score between 8-15:  moderate risk of alcohol related problems. Score between 16-19:  high risk of alcohol related problems. Score 20 or above:  warrants further diagnostic evaluation for alcohol dependence and treatment.   CLINICAL FACTORS:   Depression:   Anhedonia Hopelessness Impulsivity Insomnia Severe   Musculoskeletal: Strength & Muscle Tone: within normal limits Gait & Station: normal Patient leans: N/A  Psychiatric Specialty Exam:  Presentation  General Appearance:  Disheveled  Eye Contact: Fair  Speech: Clear and Coherent; Normal Rate  Speech Volume: Normal  Handedness: Right   Mood and Affect  Mood: Anxious; Depressed  Affect: Congruent   Thought Process  Thought Processes: Coherent  Descriptions of Associations:Intact  Orientation:Full (Time, Place and Person)  Thought Content:Logical  History of Schizophrenia/Schizoaffective  disorder:No  Duration of Psychotic Symptoms:No data recorded Hallucinations:Hallucinations: None  Ideas of Reference:None  Suicidal Thoughts:Suicidal Thoughts: Yes, Active SI Active Intent and/or Plan: With Intent; With Plan; With Means to Carry Out  Homicidal Thoughts:Homicidal Thoughts: No   Sensorium  Memory: Immediate Good; Recent Good; Remote Good  Judgment: Fair  Insight: Fair   Community education officer  Concentration: Good  Attention Span: Good  Recall: Good  Fund of Knowledge: Good  Language: Good   Psychomotor Activity  Psychomotor Activity: Psychomotor Activity: Normal   Assets  Assets: Communication Skills; Desire for Improvement; Financial Resources/Insurance; Housing; Physical Health; Resilience; Social Support   Sleep  Sleep: Sleep: Fair    Physical Exam: Physical Exam ROS Blood pressure 107/75, pulse 92, temperature 98.5 F (36.9 C), temperature source Oral, resp. rate 16, height 4\' 11"  (1.499 m), weight 54 kg, SpO2 100 %. Body mass index is 24.04 kg/m.   COGNITIVE FEATURES THAT CONTRIBUTE TO RISK:  None    SUICIDE RISK:   Severe:  Frequent, intense, and enduring suicidal ideation, specific plan, no subjective intent, but some objective markers of intent (i.e., choice of lethal method), the method is accessible, some limited preparatory behavior, evidence of impaired self-control, severe dysphoria/symptomatology, multiple risk factors present, and few if any protective factors, particularly a lack of social support.  PLAN OF CARE: see H&P  I certify that inpatient services furnished can reasonably be expected to improve the patient's condition.   Greenleigh Kauth Winfred Leeds, MD 12/04/2022, 2:59 PM

## 2022-12-04 NOTE — Tx Team (Signed)
Initial Treatment Plan 12/04/2022 7:47 AM [name] UMP:[id number]    PATIENT STRESSORS: Occupational concerns   Other: Worrying, SI, self-harm     PATIENT STRENGTHS: Average or above average intelligence  Communication skills  Physical Health  Supportive family/friends    PATIENT IDENTIFIED PROBLEMS:   Decreased in ADLs  Weight loss of 9 lbs, decreased appetite  Worrying  Anhedonia  SI   Self-harm  Recent suicide attempts  Difficulty concentrating     DISCHARGE CRITERIA:  Ability to meet basic life and health needs Reduction of life-threatening or endangering symptoms to within safe limits Verbal commitment to aftercare and medication compliance  PRELIMINARY DISCHARGE PLAN: Outpatient therapy Return to previous living arrangement Return to previous work or school arrangements  PATIENT/FAMILY INVOLVEMENT: This treatment plan has been presented to and reviewed with the patient, Andrea Mccarthy.  The patient has been given the opportunity to ask questions and make suggestions.  Shon Millet, RN 12/04/2022, 7:47 AM

## 2022-12-05 NOTE — Progress Notes (Signed)
   12/05/22 0515  15 Minute Checks  Location Bedroom  Visual Appearance Calm  Behavior Sleeping  Sleep (Behavioral Health Patients Only)  Calculate sleep? (Click Yes once per 24 hr at 0600 safety check) Yes  Documented sleep last 24 hours 10

## 2022-12-05 NOTE — BHH Group Notes (Signed)
Pt attended wrap up group 
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
## 2022-12-05 NOTE — Progress Notes (Addendum)
D:  Patient's self inventory sheet, patient has poor sleep, sleep medication helpful.  Poor appetite, low energy level, poor concentration.  Rated depression 8, hopeless #9.  Anxiety 10.  Denied withdrawals.  Checked cramping, nausea.  SI, contracts for safety.  Physical problems, pain, dizzy, headaches.  Denied physical pain.  Then wrote "head, stomach, throat".  Pain medicine, managing physical and emotional stress as well as depression.  Plans to figure out coping strategies.  No discharge plans. A:  Medications administered per MD orders.  Emotional support and encouragement given patient. R:  Denied HI, contracts for safety  Denied A/V hallucinations.  Safety maintained with 15 minute checks.

## 2022-12-05 NOTE — Group Note (Signed)
[facility] LCSW Group Therapy Note   Group Date: 12/05/2022 Start Time: 1100 End Time: 1200   Type of Therapy and Topic: Group Therapy: Avoiding Self-Sabotaging and Enabling Behaviors  Participation Level: Active  Mood:  Description of Group:  In this group, patients will learn how to identify obstacles, self-sabotaging and enabling behaviors, as well as: what are they, why do we do them and what needs these behaviors meet. Discuss unhealthy relationships and how to have positive healthy boundaries with those that sabotage and enable. Explore aspects of self-sabotage and enabling in yourself and how to limit these self-destructive behaviors in everyday life.   Therapeutic Goals: 1. Patient will identify one obstacle that relates to self-sabotage and enabling behaviors 2. Patient will identify one personal self-sabotaging or enabling behavior they did prior to admission 3. Patient will state a plan to change the above identified behavior 4. Patient will demonstrate ability to communicate their needs through discussion and/or role play.    Summary of Patient Progress: Pt was actively engaged and provided appropriate insight     Therapeutic Modalities:  Cognitive Behavioral Therapy Person-Centered Therapy Motivational Interviewing    Kymia Simi S Daniela Siebers, LCSW

## 2022-12-05 NOTE — B[facility] Suicide Risk Assessment (Signed)
[place] INPATIENT:  Family/Significant Other Suicide Prevention Education  Suicide Prevention Education:  Education Completed; Andrea Mccarthy [phone]) [phone],  (name of family member/significant other) has been identified by the patient as the family member/significant other with whom the patient will be residing, and identified as the person(s) who will aid the patient in the event of a mental health crisis (suicidal ideations/suicide attempt).  With written consent from the patient, the family member/significant other has been provided the following suicide prevention education, prior to the and/or following the discharge of the patient.  The suicide prevention education provided includes the following: Suicide risk factors Suicide prevention and interventions National Suicide Hotline telephone number [facility] assessment telephone number [facility] Emergency Assistance [place] and/or Residential Mobile Crisis Unit telephone number  Request made of family/significant other to: Remove weapons (e.g., guns, rifles, knives), all items previously/currently identified as safety concern.   Remove drugs/medications (over-the-counter, prescriptions, illicit drugs), all items previously/currently identified as a safety concern.  The family member/significant other verbalizes understanding of the suicide prevention education information provided.  The family member/significant other agrees to remove the items of safety concern listed above.  CSW spoke with patient grandpa Andrea Mccarthy to complete safety planning. Andrea Mccarthy could not tell CSW much about what has been going on with patient. Andrea Mccarthy states that patient works a lot and they only spend 10-15 minutes together a day and that patient is to herself, does not speak as much. CSW did suggest for grandpa to spend more time if you could to just check in with patient to make sure everything is alright with patient. Andrea Mccarthy confirmed  that their are no guns or weapons in his home. Also, patient will return back to his house and either him or patient mom will provide transportation.   Andrea Mccarthy 12/05/2022, 4:22 PM

## 2022-12-05 NOTE — BHH Group Notes (Signed)
Adult Psychoeducational Group Note  Date:  12/05/2022 Time:  9:41 AM  Group Topic/Focus:  Goals Group:   The focus of this group is to help patients establish daily goals to achieve during treatment and discuss how the patient can incorporate goal setting into their daily lives to aide in recovery.  Participation Level:  Active  Participation Quality:  Appropriate and Attentive  Affect:  Appropriate  Cognitive:  Appropriate  Insight: Appropriate  Engagement in Group:  Engaged  Modes of Intervention:  Discussion  Additional Comments:  Patient participated throughout the duration of goals group.  Victorino December 12/05/2022, 9:41 AM

## 2022-12-05 NOTE — Progress Notes (Signed)
  Administered PRN Hydroxyzine per [facility] per patient request.

## 2022-12-05 NOTE — Plan of Care (Signed)
Nurse discussed anxiety, depression and coping skills with patient.  

## 2022-12-05 NOTE — Progress Notes (Signed)
12/05/22 2107  Psych Admission Type (Psych Patients Only)  Admission Status Voluntary  Psychosocial Assessment  Patient Complaints Anxiety;Depression;Worrying  Eye Contact Fair  Facial Expression Sad;Anxious  Affect Depressed;Anxious  Speech Soft;Logical/coherent  Interaction Minimal  Motor Activity Other (Comment) (WDL)  Appearance/Hygiene Unremarkable  Behavior Characteristics Cooperative;Appropriate to situation  Mood Depressed;Anxious  Thought Process  Coherency WDL  Content WDL  Delusions None reported or observed  Perception WDL  Hallucination None reported or observed  Judgment Impaired  Confusion None  Danger to Self  Current suicidal ideation? Passive  Self-Injurious Behavior Some self-injurious ideation observed or expressed.  No lethal plan expressed   Agreement Not to Harm Self Yes  Description of Agreement verbal  Danger to Others  Danger to Others None reported or observed

## 2022-12-05 NOTE — Progress Notes (Incomplete Revision)
D:  Patient's self inventory sheet, patient has poor sleep, sleep medication helpful.  Poor appetite, low energy level, poor concentration.  Rated depression 8, hopeless #9.  Anxiety 10.  Denied withdrawals.  Checked cramping, nausea.  SI, contracts for safety.  Physical problems, pain, dizzy, headaches.  Denied physical pain.  Then wrote "head, stomach, throat".  Pain medicine, managing physical and emotional stress as well as depression.  Plans to figure out coping strategies.  No discharge plans. A:  Medications administered per MD orders.  Emotional support and encouragement given patient. R:  Denied HI, contracts for safety  Denied A/V hallucinations.  Safety maintained with 15 minute checks.

## 2022-12-05 NOTE — Progress Notes (Signed)
Administered PRN Hydroxyzine per MAR per patient request. 

## 2022-12-05 NOTE — Plan of Care (Signed)
  Problem: Activity: Goal: Interest or engagement in activities will improve Outcome: Progressing Goal: Sleeping patterns will improve Outcome: Progressing   Problem: Education: Goal: Emotional status will improve Outcome: Not Progressing Goal: Mental status will improve Outcome: Not Progressing

## 2022-12-06 MED ORDER — QUETIAPINE FUMARATE 50 MG PO TABS
50.0000 mg | ORAL_TABLET | Freq: Every day | ORAL | Status: DC
  Administered 2022-12-06: 50 mg via ORAL
  Filled 2022-12-06 (×2): qty 1
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
## 2022-12-06 NOTE — Plan of Care (Signed)
Nurse discussed anxiety, depression and coping skills with patient.  

## 2022-12-06 NOTE — Group Note (Signed)
Recreation Therapy Group Note   Group Topic:Team Building  Group Date: 12/06/2022 Start Time: 0935 End Time: 1020 Facilitators: Antoria Lanza-McCall, LRT,CTRS Location: [address] Dayroom   Goal Area(s) Addresses:  Patient will effectively work with peer towards shared goal.  Patient will identify skills used to make activity successful.  Patient will identify how skills used during activity can be used to reach post d/c goals.   Group Description: Straw Bridge. In teams of 3-5, patients were given 15 plastic drinking straws and an equal length of masking tape. Using the materials provided, patients were instructed to build a free standing bridge-like structure to suspend an everyday item (ex: puzzle box) off of the floor or table surface. All materials were required to be used by the team in their design. LRT facilitated post-activity discussion reviewing team process. Patients were encouraged to reflect how the skills used in this activity can be generalized to daily life post discharge.   Affect/Mood: Appropriate   Participation Level: Engaged   Participation Quality: Independent   Behavior: Appropriate   Speech/Thought Process: Focused   Insight: Good   Judgement: Good   Modes of Intervention: STEM Activity   Patient Response to Interventions:  Engaged   Education Outcome:  Acknowledges education and In group clarification offered    Clinical Observations/Individualized Feedback: Pt attended and participated in group session.    Plan: Continue to engage patient in RT group sessions 2-3x/week.   Halvor Behrend-McCall, LRT,CTRS  12/06/2022 12:25 PM

## 2022-12-06 NOTE — Progress Notes (Addendum)
D:  Patient's self inventory sheet, patient has poor sleep, sleep medication not helpful.  Poor appetite, low energy level, poor concentration.  Rated depression and anxiety 9, hopeless 8.  Denied withdrawals.  Then checked pain, headaches.  Physical pain, worst pain #4 in past 24 hours.  Physical pain, head and stomach.  Pain medicine is helpful.  Goal is open up more with listening.  No discharge plans. A:  Medications administered per MD orders.  Emotional support and encouragement given patient. R:  Patient denied SI and HI.  Denied A/V hallucinations.  Safety maintained with 15 minute checks.

## 2022-12-06 NOTE — Progress Notes (Signed)
   12/06/22 0526  15 Minute Checks  Location Bedroom  Visual Appearance Calm  Behavior Sleeping  Sleep (Behavioral Health Patients Only)  Calculate sleep? (Click Yes once per 24 hr at 0600 safety check) Yes  Documented sleep last 24 hours 8    

## 2022-12-07 MED ORDER — QUETIAPINE FUMARATE 100 MG PO TABS
100.0000 mg | ORAL_TABLET | Freq: Every day | ORAL | Status: DC
  Administered 2022-12-07 – 2022-12-09 (×3): 100 mg via ORAL
  Filled 2022-12-07 (×4): qty 1

## 2022-12-07 NOTE — Plan of Care (Signed)
  Problem: Education: Goal: Mental status will improve Outcome: Progressing Goal: Verbalization of understanding the information provided will improve Outcome: Progressing   Problem: Activity: Goal: Interest or engagement in activities will improve Outcome: Progressing   Problem: Coping: Goal: Ability to demonstrate self-control will improve Outcome: Progressing   Problem: Health Behavior/Discharge Planning: Goal: Compliance with treatment plan for underlying cause of condition will improve Outcome: Progressing   Problem: Coping: Goal: Coping ability will improve Outcome: Progressing

## 2022-12-07 NOTE — [facility] Group Notes (Signed)
.  Psychoeducational Group Note    Date:  12/07/2022 Time:1300-1400    Purpose of Group: . The group focus' on teaching patients on how to identify their needs and their Life Skills:  A group where two lists are made. What people need and what are things that we do that are unhealthy. The lists are developed by the patients and it is explained that we often do the actions that are not healthy to get our list of needs met.  Goal:: to develop the coping skills needed to get their needs met  Participation Level:  did not attend [facility] Koelzer A     

## 2022-12-07 NOTE — Progress Notes (Signed)
D- Patient alert and oriented.  Denies SI, HI, AVH. Patient reports headache with pain of 5/10. Identifies as he/him. Patient states that he wants to begin therapy after discharge and look into ADHD and ASD testing. Patient goals are to work on sleep and "lack of energy." Rates anxiety 5/10 and depression 7/10.  A- Scheduled medications administered to patient along with PRN ibuprofen and hydroxyzine, per MAR. Support and encouragement provided.  Routine safety checks conducted every 15 minutes.  Patient informed to notify staff with problems or concerns.  R- No adverse drug reactions noted. Patient contracts for safety at this time. Patient compliant with medications and treatment plan. Patient receptive, calm, and cooperative. Patient interacts well with others on the unit.  Patient remains safe at this time.

## 2022-12-07 NOTE — Group Note (Signed)
LCSW Group Therapy Note   No social work group was held; the following was provided to each patient  in lieu of in-person group:  Healthy vs. Unhealthy  Coping Skills and Supports   Unhealthy Qualities                                             Healthy Qualities Works (at first) Works   Stops working or starts hurting Continues working  Fast Usually takes time to develop  Easy Often difficult to learn  Often effortless, can be done without thought Usually takes effort, thinking about it  Usually a habit Usually unknown, has to become a habit  Can do alone Often need to reach out for help   Leads to loss Leads to gain         My Unhealthy Coping Skills                                    My Healthy Coping Skills                       My Unhealthy Supports                                           My Healthy Supports                         Therron Sells Grossman-Orr, LCSW 12/07/2022  9:14 AM     

## 2022-12-07 NOTE — Progress Notes (Signed)
   12/07/22 0000  Psych Admission Type (Psych Patients Only)  Admission Status Voluntary  Psychosocial Assessment  Patient Complaints Anxiety;Depression  Eye Contact Fair  Facial Expression Anxious  Affect Anxious;Depressed  Speech Logical/coherent  Interaction Minimal  Motor Activity Restless  Appearance/Hygiene Unremarkable  Behavior Characteristics Cooperative;Appropriate to situation  Mood Depressed;Anxious  Thought Process  Coherency WDL  Content WDL  Delusions None reported or observed  Perception WDL  Hallucination None reported or observed  Judgment Impaired  Confusion None  Danger to Self  Current suicidal ideation? Denies  Agreement Not to Harm Self Yes  Description of Agreement verbal  Danger to Others  Danger to Others None reported or observed   Alert/oriented. Makes needs/concerns known to staff. Pleasant cooperative with staff. Denies SI/HI/A/V hallucinations. Med compliant. PRN med given with good effect. Patient states went to group. Will encourage continue compliance and progression towards goals. Verbally contracted for safety. Will continue to monitor.

## 2022-12-07 NOTE — Group Note (Signed)
Date:  12/07/2022 Time:  3:55 PM  Group Topic/Focus:  Orientation:   The focus of this group is to educate the patient on the purpose and policies of crisis stabilization and provide a format to answer questions about their admission.  The group details unit policies and expectations of patients while admitted.    Participation Level:  Active  Participation Quality:  Appropriate  Affect:  Appropriate  Cognitive:  Appropriate  Insight: Appropriate  Engagement in Group:  Engaged  Modes of Intervention:  Discussion  Additional Comments:     Jerrye Beavers 12/07/2022, 3:55 PM

## 2022-12-07 NOTE — B[facility] Group Notes (Signed)
Group Focus: affirmation, clarity of thought, and goals/reality orientation Treatment Modality:  Psychoeducation Interventions utilized were assignment, group exercise, and support Purpose: To be able to understand and verbalize the reason for their admission to the hospital. To understand that the medication helps with their chemical imbalance but they also need to work on their choices in life. To be challenged to develop a list of 30 positives about themselves. Also introduce the concept that "feelings" are not reality.  Participation Level:  Active  Participation Quality:  Appropriate  Affect:  Appropriate  Cognitive:  Appropriate  Insight:  Improving  Engagement in Group:  Engaged  Additional Comments:  rates her energy at a 7.5/10. Participated in the group.  Paulino Rily

## 2022-12-07 NOTE — Progress Notes (Signed)
[place] Group Notes:  (Nursing/MHT/Case Management/Adjunct)  Date:  12/07/2022  Time:  2000  Type of Therapy:   wrap up group  Participation Level:  Active  Participation Quality:  Appropriate, Attentive, Sharing, and Supportive  Affect:  Appropriate  Cognitive:  Alert  Insight:  Improving  Engagement in Group:  Engaged  Modes of Intervention:  Clarification, Education, and Support  Summary of Progress/Problems: Positive thinking and positive change were discussed.   Shellia Cleverly 12/07/2022, 8:33 PM
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
## 2022-12-07 NOTE — Progress Notes (Addendum)
  D. Pt presents with an anxious affect/mood, friendly upon approach. Per pt's self inventory, pt rated his depression,hopelessness and anxiety a 7/6/8, respectively. Pt rated her sleep, concentration and appetite, 'poor' today, and described her energy level as 'hyper'. Pt has been visible in the milieu, observed interacting well with peers and attending groups. Pt currently denies SI/HI and AVH.  A. Labs and vitals monitored. Pt given prn Vistaril for anxiety this am, and prn ibuprofen for headache, menses cramps rated 5/0 pain.  Pt supported emotionally and encouraged to express concerns and ask questions.   R. Pt remains safe with 15 minute checks. Will continue POC.    12/07/22 0900  Psych Admission Type (Psych Patients Only)  Admission Status Voluntary  Psychosocial Assessment  Patient Complaints Anxiety  Eye Contact Fair  Facial Expression Anxious  Affect Anxious  Speech Logical/coherent  Interaction Assertive  Motor Activity Fidgety  Appearance/Hygiene In scrubs  Behavior Characteristics Cooperative;Appropriate to situation  Mood Anxious  Thought Process  Coherency WDL  Content WDL  Delusions None reported or observed  Perception WDL  Hallucination None reported or observed  Judgment Impaired  Confusion None  Danger to Self  Current suicidal ideation? Denies  Agreement Not to Harm Self Yes  Description of Agreement verbal contract for safety  Danger to Others  Danger to Others None reported or observed

## 2022-12-07 NOTE — Plan of Care (Signed)
  Problem: Safety: Goal: Periods of time without injury will increase Outcome: Progressing   

## 2022-12-08 MED ORDER — QUETIAPINE FUMARATE 25 MG PO TABS
25.0000 mg | ORAL_TABLET | Freq: Every day | ORAL | Status: DC
  Administered 2022-12-08 – 2022-12-09 (×2): 25 mg via ORAL
  Filled 2022-12-08 (×5): qty 1

## 2022-12-08 MED ORDER — MAGNESIUM CITRATE PO SOLN
1.0000 | Freq: Once | ORAL | Status: AC
  Administered 2022-12-08: 1 via ORAL
  Filled 2022-12-08: qty 296

## 2022-12-08 NOTE — Group Note (Signed)
LCSW Group Therapy Note   Group Date: 12/08/2022 Start Time: 6606 End Time: 1145   Type of Therapy and Topic:  Group Therapy:  Communication  Participation Level:  Active   Description of Group:    In this group patients will be encouraged to explore how individuals communicate with one another appropriately and inappropriately. Patients will be guided to discuss their thoughts, feelings, and behaviors related to barriers communicating feelings, needs, and stressors. The group will process together ways to execute positive and appropriate communications, with attention given to how one use behavior, tone, and body language to communicate. Patient will be encouraged to reflect on an incident where they were successfully able to communicate and the factors that they believe helped them to communicate. Each patient will be encouraged to identify specific changes they are motivated to make in order to overcome communication barriers with self, peers, authority, and parents. This group will be process-oriented, with patients participating in exploration of their own experiences as well as giving and receiving support and challenging self as well as other group members.  Therapeutic Goals: Patient will identify how people communicate (body language, facial expression, and electronics) Also discuss tone, voice and how these impact what is communicated and how the message is perceived.  Patient will identify feelings (such as fear or worry), thought process and behaviors related to why people internalize feelings rather than express self openly. Patient will identify two changes they are willing to make to overcome communication barriers. Members will then practice through Role Play how to communicate by utilizing psycho-education material (such as I Feel statements and acknowledging feelings rather than displacing on others)   Summary of Patient Progress  Patient was active and present in group.  Patient followed along with CSW while going over the reflection of communication skills. Patient provided positive feedback on how she would respond to each of the practice questions . Patient remain in group the entire session.     Therapeutic Modalities:   Cognitive Behavioral Therapy Solution Focused Therapy Motivational Interviewing Family Systems Approach   Sherre Lain, LCSWA 12/08/2022  1:20 PM

## 2022-12-08 NOTE — Progress Notes (Signed)
The focus of this group is to help patients review their daily goal of treatment and discuss progress on daily workbooks.  Pt attended the evening group and responded to all discussion prompts from the [facility]. Pt shared that today was a generally good day on the unit, the highlight of which was spending time amongst their peers in the dayroom.  On the subject of goals for the coming week, Pt mentioned only wanting to discharge, preferably by Tuesday. "I hope I can make it happen. I feel really good about it."  Pt rated their day an 8 out of 10 and their affect was appropriate.

## 2022-12-08 NOTE — [facility] Group Notes (Signed)
Adult Psychoeducational Group Note Date:  12/08/2022 Time:  0900-1000 Group Topic/Focus: PROGRESSIVE RELAXATION. Mccarthy group where deep breathing is taught and tensing and relaxation muscle groups is used. Imagery is used as well.  Pts are asked to imagine 3 pillars that hold them up when they are not able to hold themselves up and to share that with the group.   Participation Level:  did not attend  : Andrea Mccarthy    

## 2022-12-08 NOTE — Progress Notes (Addendum)
D. Pt presented brighter today, reported improved mood, but continues to endorse some anxiety. Per pt's self inventory, pt rated her depression,hopelessness and anxiety a 4/4/2, respectively. Pt has been visible in the milieu interacting well with peers, and  observed attending groups.   Pt currently denies SI/HI and AVH  A. Labs and vitals monitored. Pt given and educated on medications. Pt given prn MOM for complaints of constipation.Pt supported emotionally and encouraged to express concerns and ask questions.   R. Pt remains safe with 15 minute checks. Will continue POC.    12/08/22 0900  Psych Admission Type (Psych Patients Only)  Admission Status Voluntary  Psychosocial Assessment  Patient Complaints Anxiety  Eye Contact Fair  Facial Expression Anxious  Affect Anxious  Speech Logical/coherent  Interaction Assertive  Motor Activity Fidgety  Appearance/Hygiene Unremarkable  Behavior Characteristics Cooperative;Fidgety  Mood Anxious;Pleasant  Thought Process  Coherency WDL  Content WDL  Delusions None reported or observed  Perception WDL  Hallucination None reported or observed  Judgment Impaired  Confusion None  Danger to Self  Current suicidal ideation? Denies  Agreement Not to Harm Self Yes  Description of Agreement verbal contract for safety  Danger to Others  Danger to Others None reported or observed

## 2022-12-08 NOTE — Progress Notes (Signed)
   12/08/22 4287  15 Minute Checks  Location Bedroom  Visual Appearance Calm  Behavior Sleeping  Sleep (Behavioral Health Patients Only)  Calculate sleep? (Click Yes once per 24 hr at 0600 safety check) Yes  Documented sleep last 24 hours 7.5

## 2022-12-08 NOTE — [facility] Group Notes (Signed)
Adult Psychoeducational Group  Date:  12/08/2022 Time: 1300-1400  Group Topic/Focus: Continuation of the group from Saturday. Looking at the lists that were created and talking about what needs to be done with the homework of 30 positives about themselves.                                     Talking about taking their power back and helping themselves to develop a positive self esteem.      Participation Quality:  did not attend  Jagdeep Ancheta A  
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
## 2022-12-09 ENCOUNTER — Encounter (HOSPITAL_COMMUNITY): Payer: Self-pay

## 2022-12-09 LAB — GLUCOSE, CAPILLARY: Glucose-Capillary: 103 mg/dL — ABNORMAL HIGH (ref 70–99)

## 2022-12-09 MED ORDER — QUETIAPINE 12.5 MG HALF TABLET
12.5000 mg | ORAL_TABLET | Freq: Every day | ORAL | Status: DC
  Administered 2022-12-10: 12.5 mg via ORAL
  Filled 2022-12-09 (×2): qty 1

## 2022-12-09 NOTE — BHH Group Notes (Signed)
Spiritual care group on grief and loss facilitated by chaplain Toniette Devera Andree Elk and Lysle Morales, counseling intern.   Group Goal: Support / Education around grief and loss  Members engage in facilitated group support and psycho-social education.  Group Description:  Following introductions and group rules, group members engaged in facilitated group dialogue and support around topic of loss, with particular support around experiences of loss in their lives. Group Identified types of loss (relationships / self / things) and identified patterns, circumstances, and changes that precipitate losses. Reflected on thoughts / feelings around loss, normalized grief responses, and recognized variety in grief experience. Group encouraged individual reflection on safe space and on the coping skills that they are already utilizing.   Group drew on Adlerian / Rogerian and narrative framework  Patient Progress: Andrea Mccarthy was an active participant during group today.  She shared right away about the significant losses she has experienced.  Kyren experienced a medical event during group but was quickly assisted by other patients, and staff upon being alerted.

## 2022-12-09 NOTE — Plan of Care (Signed)
Nurse discussed coping skills with patient.  

## 2022-12-09 NOTE — Progress Notes (Signed)
D:  Patient 's self inventory sheet, patient sleeps good, sleep medication helpful.  Good appetite, normal energy level, good concentration.  Rated depression 4, hopeless 2, anxiety 2.  Denied withdrawals, checked cramping.  Denied SI.  Physical problems, headaches, physical pain, lower body, worst pain #2.  Goal is discharge plan.  Plans to attend groups.  No discharge plans. A:  Medications administered per MD orders.  Emotional support and encouragement given patient. R:  Denied SI and HI, contracts for safety.  Denied A/V hallucinations.  Safety maintained with 15 minute checks.

## 2022-12-09 NOTE — BH IP Treatment Plan (Signed)
Interdisciplinary Treatment and Diagnostic Plan Update  12/09/2022 Time of Session: 8:30am, update Andrea Mccarthy MRN: [id number]  Principal Diagnosis: MDD (major depressive disorder), recurrent severe, without psychosis ([place])  Secondary Diagnoses: Principal Problem:   MDD (major depressive disorder), recurrent severe, without psychosis ([place])   Current Medications:  Current Facility-Administered Medications  Medication Dose Route Frequency Provider Last Rate Last Admin   alum & mag hydroxide-simeth (MAALOX/MYLANTA) 200-200-20 MG/5ML suspension 30 mL  30 mL Oral Q4H PRN Revonda Humphrey, NP       hydrOXYzine (ATARAX) tablet 25 mg  25 mg Oral TID PRN Onuoha, Chinwendu V, NP   25 mg at 12/09/22 0801   ibuprofen (ADVIL) tablet 200 mg  200 mg Oral Q6H PRN Onuoha, Chinwendu V, NP   200 mg at 12/07/22 2106   magnesium hydroxide (MILK OF MAGNESIA) suspension 30 mL  30 mL Oral Daily PRN Revonda Humphrey, NP   30 mL at 12/08/22 0812   mirtazapine (REMERON) tablet 15 mg  15 mg Oral QHS Attiah, Nadir, MD   15 mg at 12/08/22 2128   ondansetron (ZOFRAN) tablet 4 mg  4 mg Oral Q8H PRN Winfred Leeds, Nadir, MD   4 mg at 12/05/22 0814   QUEtiapine (SEROQUEL) tablet 100 mg  100 mg Oral QHS Attiah, Nadir, MD   100 mg at 12/08/22 2127   [START ON 12/10/2022] QUEtiapine (SEROQUEL) tablet 12.5 mg  12.5 mg Oral Daily Attiah, Nadir, MD       PTA Medications: No medications prior to admission.    Patient Stressors: Occupational concerns   Other: Worrying, SI, self-harm    Patient Strengths: Average or above average intelligence  Communication skills  Physical Health  Supportive family/friends   Treatment Modalities: Medication Management, Group therapy, Case management,  1 to 1 session with clinician, Psychoeducation, Recreational therapy.   Physician Treatment Plan for Primary Diagnosis: MDD (major depressive disorder), recurrent severe, without psychosis ([place]) Long Term Goal(s): Improvement in  symptoms so as ready for discharge   Short Term Goals: Ability to identify changes in lifestyle to reduce recurrence of condition will improve Ability to verbalize feelings will improve Ability to disclose and discuss suicidal ideas Ability to demonstrate self-control will improve  Medication Management: Evaluate patient's response, side effects, and tolerance of medication regimen.  Therapeutic Interventions: 1 to 1 sessions, Unit Group sessions and Medication administration.  Evaluation of Outcomes: Progressing  Physician Treatment Plan for Secondary Diagnosis: Principal Problem:   MDD (major depressive disorder), recurrent severe, without psychosis ([place])  Long Term Goal(s): Improvement in symptoms so as ready for discharge   Short Term Goals: Ability to identify changes in lifestyle to reduce recurrence of condition will improve Ability to verbalize feelings will improve Ability to disclose and discuss suicidal ideas Ability to demonstrate self-control will improve     Medication Management: Evaluate patient's response, side effects, and tolerance of medication regimen.  Therapeutic Interventions: 1 to 1 sessions, Unit Group sessions and Medication administration.  Evaluation of Outcomes: Progressing   RN Treatment Plan for Primary Diagnosis: MDD (major depressive disorder), recurrent severe, without psychosis ([place]) Long Term Goal(s): Knowledge of disease and therapeutic regimen to maintain health will improve  Short Term Goals: Ability to remain free from injury will improve, Ability to verbalize frustration and anger appropriately will improve, Ability to demonstrate self-control, Ability to participate in decision making will improve, Ability to verbalize feelings will improve, Ability to disclose and discuss suicidal ideas, Ability to identify and develop effective coping behaviors  will improve, and Compliance with prescribed medications will improve  Medication Management:  RN will administer medications as ordered by provider, will assess and evaluate patient's response and provide education to patient for prescribed medication. RN will report any adverse and/or side effects to prescribing provider.  Therapeutic Interventions: 1 on 1 counseling sessions, Psychoeducation, Medication administration, Evaluate responses to treatment, Monitor vital signs and CBGs as ordered, Perform/monitor CIWA, COWS, AIMS and Fall Risk screenings as ordered, Perform wound care treatments as ordered.  Evaluation of Outcomes: Progressing   LCSW Treatment Plan for Primary Diagnosis: MDD (major depressive disorder), recurrent severe, without psychosis ([place]) Long Term Goal(s): Safe transition to appropriate next level of care at discharge, Engage patient in therapeutic group addressing interpersonal concerns.  Short Term Goals: Engage patient in aftercare planning with referrals and resources, Increase social support, Increase ability to appropriately verbalize feelings, Increase emotional regulation, Facilitate acceptance of mental health diagnosis and concerns, Facilitate patient progression through stages of change regarding substance use diagnoses and concerns, Identify triggers associated with mental health/substance abuse issues, and Increase skills for wellness and recovery  Therapeutic Interventions: Assess for all discharge needs, 1 to 1 time with Social worker, Explore available resources and support systems, Assess for adequacy in community support network, Educate family and significant other(s) on suicide prevention, Complete Psychosocial Assessment, Interpersonal group therapy.  Evaluation of Outcomes: Progressing   Progress in Treatment: Attending groups: Yes. Participating in groups: Yes. Taking medication as prescribed: Yes. Toleration medication: Yes. Family/Significant other contact made: Yes, individual(s) contacted:  Ileene Hutchinson [facility]  Patient  understands diagnosis: Yes. Discussing patient identified problems/goals with staff: Yes. Medical problems stabilized or resolved: Yes. Denies suicidal/homicidal ideation: Yes. Issues/concerns per patient self-inventory: No.   New problem(s) identified: No, Describe:  none reported   New Short Term/Long Term Goal(s):  medication stabilization, elimination of SI thoughts, development of comprehensive mental wellness plan.    Patient Goals:  Pt continues to work on initial tx team goals.  Care team continues to offer support and interventions appropriate to goals.   Discharge Plan or Barriers:  Pt has med management and therapy set up.     Reason for Continuation of Hospitalization: Anxiety Depression Medication stabilization Suicidal ideation  Estimated Length of Stay: 1-3 days  Last 3 [place] Suicide Severity Risk Score: [place] Admission (Current) from 12/03/2022 in [place] [facility] Most recent reading at 12/03/2022 11:15 PM ED from 12/03/2022 in [facility] Most recent reading at 12/03/2022  6:06 PM ED from 07/01/2022 in [facility] at [facility]  Most recent reading at 07/01/2022 12:42 PM  C-SSRS RISK CATEGORY High Risk Error: Question 6 not populated No Risk       Last PHQ 2/9 Scores:     No data to display          Scribe for Treatment Team: Zachery Conch, LCSW 12/09/2022 1:52 PM

## 2022-12-09 NOTE — Progress Notes (Signed)
Pt rates depression 5/10 and anxiety 0/10. Pt reports a good appetite, and no physical problems. Pt denies SI/HI/AVH and verbally contracts for safety. Provided support and encouragement. Pt safe on the unit. Q 15 minute safety checks continued.

## 2022-12-09 NOTE — Progress Notes (Addendum)
   12/08/22 2000  Psychosocial Assessment  Patient Complaints Anxiety;Depression  Eye Contact Fair  Facial Expression Anxious  Affect Anxious;Depressed  Speech Logical/coherent  Interaction Assertive  Motor Activity Fidgety  Appearance/Hygiene Unremarkable  Behavior Characteristics Cooperative;Anxious  Mood Anxious;Pleasant;Depressed  Thought Process  Coherency WDL  Content WDL  Delusions None reported or observed  Perception WDL  Hallucination None reported or observed  Judgment Limited  Confusion None  Danger to Self  Current suicidal ideation? Denies  Self-Injurious Behavior No self-injurious ideation or behavior indicators observed or expressed      12/08/22 2000  Psychosocial Assessment  Patient Complaints Anxiety;Depression  Eye Contact Fair  Facial Expression Anxious  Affect Anxious;Depressed  Speech Logical/coherent  Interaction Assertive  Motor Activity Fidgety  Appearance/Hygiene Unremarkable  Behavior Characteristics Cooperative;Anxious  Mood Anxious;Pleasant;Depressed  Thought Process  Coherency WDL  Content WDL  Delusions None reported or observed  Perception WDL  Hallucination None reported or observed  Judgment Limited  Confusion None  Danger to Self  Current suicidal ideation? Denies  Self-Injurious Behavior No self-injurious ideation or behavior indicators observed or expressed    Patient complains of constipation . No result MOM. NP notified and orders received.
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
## 2022-12-10 MED ORDER — MIRTAZAPINE 15 MG PO TABS: 15.0000 mg | ORAL_TABLET | Freq: Every day | ORAL | 0 refills | Status: DC

## 2022-12-10 MED ORDER — HYDROXYZINE HCL 25 MG PO TABS: 25.0000 mg | ORAL_TABLET | Freq: Three times a day (TID) | ORAL | 0 refills | Status: DC | PRN

## 2022-12-10 MED ORDER — QUETIAPINE FUMARATE 100 MG PO TABS: 100.0000 mg | ORAL_TABLET | Freq: Every day | ORAL | 0 refills | Status: DC

## 2022-12-10 NOTE — Progress Notes (Signed)
Adult Psychoeducational Group Note  Date:  12/10/2022 Time:  10:48 AM  Group Topic/Focus:  Goals Group:   The focus of this group is to help patients establish daily goals to achieve during treatment and discuss how the patient can incorporate goal setting into their daily lives to aide in recovery.  Participation Level:  Active  Participation Quality:  Appropriate  Affect:  Appropriate  Cognitive:  Appropriate  Insight: Good  Engagement in Group:  Engaged  Modes of Intervention:  Discussion  Additional Comments:  PT discussed discharge planning and expressed positive thoughts for post-discharge plans  [facility] 12/10/2022, 10:48 AM

## 2022-12-10 NOTE — Progress Notes (Signed)
   12/10/22 0900  Psych Admission Type (Psych Patients Only)  Admission Status Voluntary  Psychosocial Assessment  Patient Complaints None  Eye Contact Fair  Facial Expression Anxious  Affect Appropriate to circumstance  Speech Logical/coherent  Interaction Assertive  Motor Activity Fidgety  Appearance/Hygiene Unremarkable  Behavior Characteristics Cooperative  Mood Anxious;Pleasant  Thought Process  Coherency WDL  Content WDL  Delusions None reported or observed  Perception WDL  Hallucination None reported or observed  Judgment Limited  Confusion None  Danger to Self  Current suicidal ideation? Denies  Self-Injurious Behavior No self-injurious ideation or behavior indicators observed or expressed   Agreement Not to Harm Self Yes  Description of Agreement verbal contract  Danger to Others  Danger to Others None reported or observed

## 2022-12-10 NOTE — Discharge Summary (Signed)
Physician Discharge Summary Note  Patient:  Andrea Mccarthy is an 22 y.o., female MRN:  [id number] DOB:  August 20, 2001 Patient phone:  [phone] (home)  Patient address:   [place] [address],  Total Time spent with patient: 20 minutes  Date of Admission:  12/03/2022 Date of Discharge: 12/10/2022  Reason for Admission:  Andrea Mccarthy is a 22 y.o., female with a past psychiatric history significant for depression and anxiety who presents to the [facility] from behavioral health urgent care for evaluation and management of severe depression and SI with plan to overdose.  According to outside records, the patient presented with worsening depression, SI with plan to overdose on DayQuil and NyQuil to kill herself.The patient is currently on Hospital Day 7.     Principal Problem: MDD (major depressive disorder), recurrent severe, without psychosis ([place]) Discharge Diagnoses: Principal Problem:   MDD (major depressive disorder), recurrent severe, without psychosis ([place])   Past Psychiatric History: see H&P  Past Medical History:  Past Medical History:  Diagnosis Date   IBS (irritable bowel syndrome)    Migraine    History reviewed. No pertinent surgical history. Family History:  Family History  Family history unknown: Yes   Family Psychiatric  History: See H&P Social History:  Social History   Substance and Sexual Activity  Alcohol Use No     Social History   Substance and Sexual Activity  Drug Use Never    Social History   Socioeconomic History   Marital status: Single    Spouse name: Not on file   Number of children: Not on file   Years of education: Not on file   Highest education level: Not on file  Occupational History   Not on file  Tobacco Use   Smoking status: Never   Smokeless tobacco: Never  Vaping Use   Vaping Use: Never used  Substance and Sexual Activity   Alcohol use: No   Drug use: Never   Sexual activity:  Not on file  Other Topics Concern   Not on file  Social History Narrative   Not on file   Social Determinants of Health   Financial Resource Strain: Not on file  Food Insecurity: No Food Insecurity (12/04/2022)   Hunger Vital Sign    Worried About Running Out of Food in the Last Year: Never true    Ran Out of Food in the Last Year: Never true  Transportation Needs: No Transportation Needs (12/04/2022)   PRAPARE - Hydrologist (Medical): No    Lack of Transportation (Non-Medical): No  Physical Activity: Not on file  Stress: Not on file  Social Connections: Not on file    Hospital Course:  HOSPITAL COURSE:  During the patient's hospitalization, patient had extensive initial psychiatric evaluation, and follow-up psychiatric evaluations every day.  Psychiatric diagnoses provided upon initial assessment: The patient has a history of depression and self-mutilating behavior.  Patient's psychiatric medications were adjusted on admission: Patient was restarted on her medications that included Remeron 15 mg at night and Seroquel 100 mg at bedtime with a daytime dose being reduced due to hypotension.  During the hospitalization, other adjustments were made to the patient's psychiatric medication regimen: Seroquel is being tapered down in the daytime and she will continue with the bedtime Seroquel.  Patient's care was discussed during the interdisciplinary team meeting every day during the hospitalization.  The patient denied any having side effects to prescribed psychiatric  medication.  However yesterday she had an episode when she fell to the floor and felt somewhat dizzy.  Her daytime Seroquel has been discontinued.  Gradually, patient started adjusting to milieu. The patient was evaluated each day by a clinical provider to ascertain response to treatment. Improvement was noted by the patient's report of decreasing symptoms, improved sleep and appetite, affect,  medication tolerance, behavior, and participation in unit programming.  Patient was asked each day to complete a self inventory noting mood, mental status, pain, new symptoms, anxiety and concerns.    Symptoms were reported as significantly decreased or resolved completely by discharge.   On day of discharge, the patient reports that their mood is stable. The patient denied having suicidal thoughts for more than 48 hours prior to discharge.  Patient denies having homicidal thoughts.  Patient denies having auditory hallucinations.  Patient denies any visual hallucinations or other symptoms of psychosis. The patient was motivated to continue taking medication with a goal of continued improvement in mental health.   The patient reports their target psychiatric symptoms of current suicidal ideations responded well to the psychiatric medications, and the patient reports overall benefit other psychiatric hospitalization. Supportive psychotherapy was provided to the patient. The patient also participated in regular group therapy while hospitalized. Coping skills, problem solving as well as relaxation therapies were also part of the unit programming.  Labs were reviewed with the patient, and abnormal results were discussed with the patient.  The patient is able to verbalize their individual safety plan to this provider.  # It is recommended to the patient to continue psychiatric medications as prescribed, after discharge from the hospital.    # It is recommended to the patient to follow up with your outpatient psychiatric provider and PCP.  # It was discussed with the patient, the impact of alcohol, drugs, tobacco have been there overall psychiatric and medical wellbeing, and total abstinence from substance use was recommended the patient.ed.  # Prescriptions provided or sent directly to preferred pharmacy at discharge. Patient agreeable to plan. Given opportunity to ask questions. Appears to feel  comfortable with discharge.    # In the event of worsening symptoms, the patient is instructed to call the crisis hotline, 911 and or go to the nearest ED for appropriate evaluation and treatment of symptoms. To follow-up with primary care provider for other medical issues, concerns and or health care needs  # Patient was discharged to live with her grandfather with a plan to follow up as noted below.  Grandfather to be contacted prior to discharge.  Physical Findings: AIMS: Facial and Oral Movements Muscles of Facial Expression: None, normal Lips and Perioral Area: None, normal Jaw: None, normal Tongue: None, normal,Extremity Movements Upper (arms, wrists, hands, fingers): None, normal Lower (legs, knees, ankles, toes): None, normal, Trunk Movements Neck, shoulders, hips: None, normal, Overall Severity Severity of abnormal movements (highest score from questions above): None, normal Incapacitation due to abnormal movements: None, normal Patient's awareness of abnormal movements (rate only patient's report): No Awareness, Dental Status Current problems with teeth and/or dentures?: No Does patient usually wear dentures?: No  CIWA:    COWS:     Musculoskeletal: Strength & Muscle Tone: within normal limits Gait & Station: normal Patient leans: N/A   Psychiatric Specialty Exam:  Presentation  General Appearance:  Appropriate for Environment  Eye Contact: Fair  Speech: Clear and Coherent  Speech Volume: Normal  Handedness: Right   Mood and Affect  Mood: Euthymic  Affect: Appropriate  Thought Process  Thought Processes: Coherent  Descriptions of Associations:Intact  Orientation:Full (Time, Place and Person)  Thought Content:Logical  History of Schizophrenia/Schizoaffective disorder:No  Duration of Psychotic Symptoms:No data recorded Hallucinations:No data recorded Ideas of Reference:None  Suicidal Thoughts:Suicidal Thoughts: No  Homicidal  Thoughts:Homicidal Thoughts: No   Sensorium  Memory: Immediate Good; Remote Fair  Judgment: Fair  Insight: Fair   Community education officer  Concentration: Fair  Attention Span: Fair  Recall: [place] of Knowledge: Good  Language: Good   Psychomotor Activity  Psychomotor Activity: Psychomotor Activity: Normal   Assets  Assets: Desire for Improvement; Communication Skills; Leisure Time; Social Support; Housing   Sleep  Sleep: Sleep: Good    Physical Exam: Physical Exam ROS Blood pressure 111/72, pulse (!) 101, temperature 98.4 F (36.9 C), temperature source Oral, resp. rate 14, height 4\' 11"  (1.499 m), weight 54 kg, SpO2 100 %. Body mass index is 24.04 kg/m.   Social History   Tobacco Use  Smoking Status Never  Smokeless Tobacco Never   Tobacco Cessation:  N/A, patient does not currently use tobacco products   Blood Alcohol level:  Lab Results  Component Value Date   ETH <10 03/47/4259    Metabolic Disorder Labs:  Lab Results  Component Value Date   HGBA1C 4.9 12/03/2022   MPG 93.93 12/03/2022   No results found for: "PROLACTIN" Lab Results  Component Value Date   CHOL 168 12/03/2022   TRIG 33 12/03/2022   HDL 66 12/03/2022   CHOLHDL 2.5 12/03/2022   VLDL 7 12/03/2022   [place] 95 12/03/2022    See Psychiatric Specialty Exam and Suicide Risk Assessment completed by Attending Physician prior to discharge.  Discharge destination:  Home  Is patient on multiple antipsychotic therapies at discharge:  No   Has Patient had three or more failed trials of antipsychotic monotherapy by history:  No  Recommended Plan for Multiple Antipsychotic Therapies: NA  Discharge Instructions     Diet - low sodium heart healthy   Complete by: As directed    Increase activity slowly   Complete by: As directed       Allergies as of 12/10/2022   No Known Allergies      Medication List     TAKE these medications      Indication   hydrOXYzine 25 MG tablet Commonly known as: ATARAX Take 1 tablet (25 mg total) by mouth 3 (three) times daily as needed for anxiety.  Indication: Feeling Anxious   mirtazapine 15 MG tablet Commonly known as: REMERON Take 1 tablet (15 mg total) by mouth at bedtime.  Indication: Major Depressive Disorder   QUEtiapine 100 MG tablet Commonly known as: SEROQUEL Take 1 tablet (100 mg total) by mouth at bedtime.  Indication: Agitation        Follow-up Information     [place] on 12/16/2022.   Why: You have a hospital follow up appointment on 12/16/22 at 11:00 am. This appointment will be held in person. Following this appointment, you will be scheduled for a clinical assessment to obtain necessary therapy and medication management services. Contact information: [place] [address] [phone]         [place]. Call.   Why: Please follow up with this provider for SAIOP and Day Program resources. Contact information: [place] [address] [phone]                 Follow-up recommendations:  Activity:  As tolerated.  Comments: The patient is doing well prior to discharge she denies any active suicidal or homicidal ideations.  She denies any dizziness or fainting spells this morning.  She is currently taking 100 mg of Seroquel at bedtime her daytime Seroquel has been discontinued.  She plans to go stay with her current father and this needs to be verified with the grandfather prior to discharge.  She would like to go to the day program and appropriate referrals to the day program are being made.  Prognosis is guarded at this time.  She has a safety plan in place and the grandmother is going to come and pick her up.  Signed: Rex Kras, MD 12/10/2022, 9:46 AM

## 2022-12-10 NOTE — Progress Notes (Addendum)
Pt discharged to lobby- grandfather was present to pick up patient. Pt was stable and appreciative at that time. All papers and electronic prescriptions were given and valuables returned. Suicide safety plan completed by patient. Copy left in chart. Verbal understanding expressed. Denies SI/HI and A/VH. Pt given opportunity to express concerns and ask questions.

## 2022-12-10 NOTE — [facility] Suicide Risk Assessment (Signed)
[facility] Discharge Suicide Risk Assessment   Principal Problem: MDD (major depressive disorder), recurrent severe, without psychosis ([place]) Discharge Diagnoses: Principal Problem:   MDD (major depressive disorder), recurrent severe, without psychosis ([place])   Total Time spent with patient: 20 minutes  Musculoskeletal: Strength & Muscle Tone: within normal limits Gait & Station: normal Patient leans: N/A  Psychiatric Specialty Exam  Presentation  General Appearance:  Disheveled  Eye Contact: Fair  Speech: Clear and Coherent; Normal Rate  Speech Volume: Normal  Handedness: Right   Mood and Affect  Mood: Anxious; Depressed  Duration of Depression Symptoms: Greater than two weeks  Affect: Congruent   Thought Process  Thought Processes: Coherent  Descriptions of Associations:Intact  Orientation:Full (Time, Place and Person)  Thought Content:Logical  History of Schizophrenia/Schizoaffective disorder:No  Duration of Psychotic Symptoms:No data recorded Hallucinations:No data recorded Ideas of Reference:None  Suicidal Thoughts:No data recorded Homicidal Thoughts:No data recorded  Sensorium  Memory: Immediate Good; Recent Good; Remote Good  Judgment: Fair  Insight: Fair   Community education officer  Concentration: Good  Attention Span: Good  Recall: Good  Fund of Knowledge: Good  Language: Good   Psychomotor Activity  Psychomotor Activity:No data recorded  Assets  Assets: Communication Skills; Desire for Improvement; Financial Resources/Insurance; Housing; Physical Health; Resilience; Social Support   Sleep  Sleep:No data recorded  Physical Exam: Physical Exam ROS Blood pressure 111/72, pulse (!) 101, temperature 98.4 F (36.9 C), temperature source Oral, resp. rate 14, height 4\' 11"  (1.499 m), weight 54 kg, SpO2 100 %. Body mass index is 24.04 kg/m.  Mental Status Per Nursing Assessment::   On Admission:  Self-harm thoughts,  Self-harm behaviors, Suicidal ideation indicated by patient  Demographic Factors:  Adolescent or young adult and Unemployed  Loss Factors: NA  Historical Factors: Prior suicide attempts and Impulsivity  Risk Reduction Factors:   Sense of responsibility to family, Living with another person, especially a relative, and Positive social support  Continued Clinical Symptoms:  Depression:   Impulsivity Insomnia  Cognitive Features That Contribute To Risk:  None    Suicide Risk:  Mild:  Suicidal ideation of limited frequency, intensity, duration, and specificity.  There are no identifiable plans, no associated intent, mild dysphoria and related symptoms, good self-control (both objective and subjective assessment), few other risk factors, and identifiable protective factors, including available and accessible social support.   Follow-up Information     [place] on 12/16/2022.   Why: You have a hospital follow up appointment on 12/16/22 at 11:00 am. This appointment will be held in person. Following this appointment, you will be scheduled for a clinical assessment to obtain necessary therapy and medication management services. Contact information: [place] [address] [phone]                 Plan Of Care/Follow-up recommendations:  Activity:  as tolerated  Ranae Palms, MD 12/10/2022, 9:09 AM

## 2022-12-10 NOTE — Progress Notes (Signed)
  [facility] Adult Case Management Discharge Plan :  Will you be returning to the same living situation after discharge:  Yes,  Patient will be returning back with her grandpa At discharge, do you have transportation home?: Yes,  Patient grandpa will be picking her up at 11 AM Do you have the ability to pay for your medications: Yes,  Patient has [facility] Medicaid Prepaid Health Plan   Release of information consent forms completed and in the chart;  Patient's signature needed at discharge.  Patient to Follow up at:  Follow-up Information     [place] on 12/16/2022.   Why: You have a hospital follow up appointment on 12/16/22 at 11:00 am. This appointment will be held in person. Following this appointment, you will be scheduled for a clinical assessment to obtain necessary therapy and medication management services. Contact information: [place] [address] [phone]         [place]. Call.   Why: Please follow up with this provider for SAIOP and Day Program resources. Contact information: [place] [address] [phone]                 Next level of care provider has access to [place] and Suicide Prevention discussed: Yes,  Ileene Hutchinson ( grandpa) [phone]      Has patient been referred to the Quitline?: N/A patient is not a smoker  Patient has been referred for addiction treatment: N/A  Sherre Lain, LCSWA 12/10/2022, 9:28 AM

## 2022-12-16 DIAGNOSIS — F332 Major depressive disorder, recurrent severe without psychotic features: Secondary | ICD-10-CM | POA: Diagnosis not present

## 2022-12-31 DIAGNOSIS — F332 Major depressive disorder, recurrent severe without psychotic features: Secondary | ICD-10-CM | POA: Diagnosis not present

## 2023-01-27 ENCOUNTER — Other Ambulatory Visit: Payer: Self-pay

## 2023-01-27 ENCOUNTER — Encounter: Payer: Self-pay | Admitting: Psychiatry

## 2023-01-27 ENCOUNTER — Emergency Department
Admission: EM | Admit: 2023-01-27 | Discharge: 2023-01-27 | Disposition: A | Discharge status: Other Acute Inpatient Hospital | Payer: Medicaid Other | Source: Home / Self Care | Attending: Emergency Medicine | Admitting: Emergency Medicine

## 2023-01-27 ENCOUNTER — Inpatient Hospital Stay
Admission: AD | Admit: 2023-01-27 | Discharge: 2023-01-31 | DRG: 885 | Disposition: A | Discharge status: Home / Self Care | Payer: Medicaid Other | Source: Intra-hospital | Attending: Psychiatry | Admitting: Psychiatry

## 2023-01-27 DIAGNOSIS — T50902A Poisoning by unspecified drugs, medicaments and biological substances, intentional self-harm, initial encounter: Secondary | ICD-10-CM | POA: Insufficient documentation

## 2023-01-27 DIAGNOSIS — T43592A Poisoning by other antipsychotics and neuroleptics, intentional self-harm, initial encounter: Secondary | ICD-10-CM | POA: Diagnosis present

## 2023-01-27 DIAGNOSIS — F418 Other specified anxiety disorders: Secondary | ICD-10-CM | POA: Diagnosis present

## 2023-01-27 DIAGNOSIS — R110 Nausea: Secondary | ICD-10-CM | POA: Diagnosis present

## 2023-01-27 DIAGNOSIS — F640 Transsexualism: Secondary | ICD-10-CM | POA: Diagnosis present

## 2023-01-27 DIAGNOSIS — Z79899 Other long term (current) drug therapy: Secondary | ICD-10-CM | POA: Diagnosis not present

## 2023-01-27 DIAGNOSIS — R45851 Suicidal ideations: Secondary | ICD-10-CM

## 2023-01-27 DIAGNOSIS — Z1152 Encounter for screening for COVID-19: Secondary | ICD-10-CM | POA: Insufficient documentation

## 2023-01-27 DIAGNOSIS — F419 Anxiety disorder, unspecified: Secondary | ICD-10-CM | POA: Diagnosis present

## 2023-01-27 DIAGNOSIS — R000 Tachycardia, unspecified: Secondary | ICD-10-CM | POA: Insufficient documentation

## 2023-01-27 DIAGNOSIS — Z9151 Personal history of suicidal behavior: Secondary | ICD-10-CM

## 2023-01-27 DIAGNOSIS — L270 Generalized skin eruption due to drugs and medicaments taken internally: Secondary | ICD-10-CM | POA: Diagnosis present

## 2023-01-27 DIAGNOSIS — F332 Major depressive disorder, recurrent severe without psychotic features: Secondary | ICD-10-CM | POA: Diagnosis not present

## 2023-01-27 DIAGNOSIS — Z818 Family history of other mental and behavioral disorders: Secondary | ICD-10-CM | POA: Diagnosis not present

## 2023-01-27 DIAGNOSIS — T43292A Poisoning by other antidepressants, intentional self-harm, initial encounter: Secondary | ICD-10-CM | POA: Diagnosis not present

## 2023-01-27 DIAGNOSIS — Z9152 Personal history of nonsuicidal self-harm: Secondary | ICD-10-CM | POA: Diagnosis not present

## 2023-01-27 DIAGNOSIS — L298 Other pruritus: Secondary | ICD-10-CM | POA: Diagnosis present

## 2023-01-27 DIAGNOSIS — T43222A Poisoning by selective serotonin reuptake inhibitors, intentional self-harm, initial encounter: Secondary | ICD-10-CM | POA: Diagnosis present

## 2023-01-27 DIAGNOSIS — T43022A Poisoning by tetracyclic antidepressants, intentional self-harm, initial encounter: Secondary | ICD-10-CM | POA: Diagnosis present

## 2023-01-27 LAB — URINE DRUG SCREEN, QUALITATIVE (ARMC ONLY)
Amphetamines, Ur Screen: NOT DETECTED
Barbiturates, Ur Screen: NOT DETECTED
Benzodiazepine, Ur Scrn: NOT DETECTED
Cannabinoid 50 Ng, Ur ~~LOC~~: NOT DETECTED
Cocaine Metabolite,Ur ~~LOC~~: NOT DETECTED
MDMA (Ecstasy)Ur Screen: NOT DETECTED
Methadone Scn, Ur: NOT DETECTED
Opiate, Ur Screen: NOT DETECTED
Phencyclidine (PCP) Ur S: NOT DETECTED
Tricyclic, Ur Screen: NOT DETECTED

## 2023-01-27 LAB — RESP PANEL BY RT-PCR (RSV, FLU A&B, COVID)  RVPGX2
Influenza A by PCR: NEGATIVE
Influenza B by PCR: NEGATIVE
Resp Syncytial Virus by PCR: NEGATIVE
SARS Coronavirus 2 by RT PCR: NEGATIVE

## 2023-01-27 LAB — COMPREHENSIVE METABOLIC PANEL
ALT: 17 U/L (ref 0–44)
AST: 29 U/L (ref 15–41)
Albumin: 4.4 g/dL (ref 3.5–5.0)
Alkaline Phosphatase: 68 U/L (ref 38–126)
Anion gap: 7 (ref 5–15)
BUN: 11 mg/dL (ref 6–20)
CO2: 24 mmol/L (ref 22–32)
Calcium: 8.5 mg/dL — ABNORMAL LOW (ref 8.9–10.3)
Chloride: 105 mmol/L (ref 98–111)
Creatinine, Ser: 0.9 mg/dL (ref 0.44–1.00)
GFR, Estimated: >60 mL/min (ref >60)
Glucose, Bld: 155 mg/dL — ABNORMAL HIGH (ref 70–99)
Potassium: 3.2 mmol/L — ABNORMAL LOW (ref 3.5–5.1)
Sodium: 136 mmol/L (ref 135–145)
Total Bilirubin: 0.5 mg/dL (ref 0.3–1.2)
Total Protein: 7.7 g/dL (ref 6.5–8.1)

## 2023-01-27 LAB — MAGNESIUM: Magnesium: 1.9 mg/dL (ref 1.7–2.4)

## 2023-01-27 LAB — CBC
HCT: 38.7 % (ref 36.0–46.0)
Hemoglobin: 12.8 g/dL (ref 12.0–15.0)
MCH: 30.6 pg (ref 26.0–34.0)
MCHC: 33.1 g/dL (ref 30.0–36.0)
MCV: 92.6 fL (ref 80.0–100.0)
Platelets: 235 10*3/uL (ref 150–400)
RBC: 4.18 MIL/uL (ref 3.87–5.11)
RDW: 12.3 % (ref 11.5–15.5)
WBC: 7.1 10*3/uL (ref 4.0–10.5)
nRBC: 0 % (ref 0.0–0.2)

## 2023-01-27 LAB — SALICYLATE LEVEL: Salicylate Lvl: <7 mg/dL — ABNORMAL LOW (ref 7.0–30.0)

## 2023-01-27 LAB — POC URINE PREG, ED: Preg Test, Ur: NEGATIVE

## 2023-01-27 LAB — ACETAMINOPHEN LEVEL
Acetaminophen (Tylenol), Serum: <10 ug/mL — ABNORMAL LOW (ref 10–30)
Acetaminophen (Tylenol), Serum: <10 ug/mL — ABNORMAL LOW (ref 10–30)

## 2023-01-27 LAB — ETHANOL: Alcohol, Ethyl (B): <10 mg/dL (ref <10)

## 2023-01-27 MED ORDER — POTASSIUM CHLORIDE CRYS ER 20 MEQ PO TBCR
40.0000 meq | EXTENDED_RELEASE_TABLET | Freq: Once | ORAL | Status: AC
  Administered 2023-01-27: 40 meq via ORAL
  Filled 2023-01-27: qty 2

## 2023-01-27 MED ORDER — ACETAMINOPHEN 325 MG PO TABS
650.0000 mg | ORAL_TABLET | Freq: Four times a day (QID) | ORAL | Status: DC | PRN
  Administered 2023-01-28 – 2023-01-29 (×2): 650 mg via ORAL
  Filled 2023-01-27 (×2): qty 2

## 2023-01-27 MED ORDER — HALOPERIDOL 5 MG PO TABS: 5.0000 mg | ORAL_TABLET | Freq: Two times a day (BID) | ORAL | Status: AC | PRN

## 2023-01-27 MED ORDER — ALUM & MAG HYDROXIDE-SIMETH 200-200-20 MG/5ML PO SUSP: 30.0000 mL | ORAL | Status: DC | PRN

## 2023-01-27 MED ORDER — DIPHENHYDRAMINE HCL 25 MG PO CAPS
50.0000 mg | ORAL_CAPSULE | Freq: Two times a day (BID) | ORAL | Status: AC | PRN
  Administered 2023-01-28: 50 mg via ORAL
  Filled 2023-01-27: qty 2

## 2023-01-27 MED ORDER — MAGNESIUM HYDROXIDE 400 MG/5ML PO SUSP: 30.0000 mL | Freq: Every day | ORAL | Status: DC | PRN

## 2023-01-27 MED ORDER — LORAZEPAM 2 MG/ML IJ SOLN: 2.0000 mg | Freq: Two times a day (BID) | INTRAMUSCULAR | Status: AC | PRN

## 2023-01-27 MED ORDER — DIPHENHYDRAMINE HCL 50 MG/ML IJ SOLN: 50.0000 mg | Freq: Two times a day (BID) | INTRAMUSCULAR | Status: AC | PRN

## 2023-01-27 MED ORDER — LACTATED RINGERS IV BOLUS
1000.0000 mL | Freq: Once | INTRAVENOUS | Status: AC
  Administered 2023-01-27: 1000 mL via INTRAVENOUS

## 2023-01-27 MED ORDER — HALOPERIDOL LACTATE 5 MG/ML IJ SOLN: 5.0000 mg | Freq: Two times a day (BID) | INTRAMUSCULAR | Status: AC | PRN

## 2023-01-27 MED ORDER — LORAZEPAM 2 MG PO TABS: 2.0000 mg | ORAL_TABLET | Freq: Two times a day (BID) | ORAL | Status: AC | PRN

## 2023-01-27 NOTE — Consult Note (Addendum)
Psych Team attempted to evaluate patient. Patient sleeping soundly and minimally arouses to tactile stimuli. Patient opened eyes briefly and drowsily states, "now is not a good time". Psych Team will re-attempt to assess when patient is awake and able to participate in evaluation.    Charline Bills, NP  01/27/2023 1020

## 2023-01-27 NOTE — ED Notes (Signed)
[facility] 

## 2023-01-27 NOTE — Consult Note (Addendum)
[facility] Face-to-Face Psychiatry Consult   Reason for Consult:  Psychiatric Evaluation   Referring Physician:  Dr. Jacelyn Grip  Patient Identification: Andrea Mccarthy MRN:  [id number] Principal Diagnosis: MDD (major depressive disorder), recurrent severe, without psychosis ([place]) Diagnosis:  Principal Problem:   MDD (major depressive disorder), recurrent severe, without psychosis ([place]) Active Problems:   Other specified anxiety disorders   Intentional overdose ([place])   Suicidal ideation   Total Time spent with patient: 1 hour  Subjective: "Just a lot of stress building up and not able to get the resources that I need". Andrea Mccarthy is a 22 y.o. female patient admitted with suicidal ideation with attempted overdose.  HPI: Patient seen and chart reviewed.  22 year old female patient presented to the emergency room voluntarily with a chief complaint of suicidal ideation and a recent suicide attempt by ingesting her prescribed psychotropic medication. The patient has a charted history of depression and psychiatric hospitalization January 2024.  Patient placed under Involuntary Commitment (IVC) by EDP for history of depression, attempted suicide by ingestion of medications per IVC paperwork.  On evaluation, the patient reports a history of depression and cites stress as the reason for her suicide attempt. The patient reports being under significant stress due to "life in general".  When asked to elaborate, patient states, work and ongoing anxiety. The patient reports she works at [employer] in [place]. Patient was admitted to the [facility] unit in January 2024 for depression but reports that the intervention was not helpful. She reports the increase in suicidal thoughts may be associated with the initiation of Zoloft during her [facility] admission. She reports intensive outpatient treatment would be more beneficial and currently follows up with [facility] "only once a month". She reports calling [facility], prior to coming to the emergency room "but they did not pick up".  The patient is alert but presents with poor eye contact, keeping eyes closed throughout the assessment.  Exhibits signs of depression, evidenced by tearfulness during evaluation.  She denies current suicidal or homicidal ideation, as well as auditory or visual hallucinations. She endorses ongoing anxiety. She denies illicit drug use or alcohol use.  UDS returned negative results; ETOH <10.  The patient consents for the Psych Team to call her mother.    Collateral information obtained from mother, Yanitzia Traino [phone]). The mother reports patient believes that the antidepressant medication may be contributing to her suicidal thoughts.  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 She reports the patient began testosterone therapy at [facility] last year. Mother reports patient is followed by [facility] for medication management and therapy. The patient's mother reports a past seizure during psychiatric admission at [facility] in January 2024.   Past Psychiatric History: The patient has a documented history of depression, with previous psychiatric admission for management.  Most recent psychiatric admission at [facility] unit in January 2024 for depression.  Risk to Self:  Yes Risk to Others: No    Prior Inpatient Therapy:  Yes [facility] 2024 Prior Outpatient Therapy:  [facility]   Past Medical History:  Past Medical History:  Diagnosis Date   IBS (irritable bowel syndrome)    Migraine    History reviewed. No pertinent surgical history. Family History:  Family History  Family history unknown: Yes   Family Psychiatric  History: None reported  Social History:  Social History   Substance and Sexual Activity  Alcohol Use No     Social History    Substance and Sexual Activity  Drug Use Never    Social History   Socioeconomic History   Marital status: Single    Spouse name: Not on file   Number of children: Not on file   Years of education: Not on file   Highest education level: Not on file  Occupational History   Not on file  Tobacco Use   Smoking status: Never   Smokeless tobacco: Never  Vaping Use   Vaping Use: Never used  Substance and Sexual Activity   Alcohol use: No   Drug use: Never   Sexual activity: Not on file  Other Topics Concern   Not on file  Social History Narrative   Not on file   Social Determinants of Health   Financial Resource Strain: Not on file  Food Insecurity: No Food Insecurity (12/04/2022)   Hunger Vital Sign    Worried About Running Out of Food in the Last Year: Never true    Ran Out of Food in the Last Year: Never true  Transportation Needs: No Transportation Needs (12/04/2022)   PRAPARE - Hydrologist (Medical): No    Lack of Transportation (Non-Medical): No  Physical Activity: Not on file  Stress: Not on file  Social Connections: Not on file   Additional Social History:    Allergies:  No Known Allergies  Labs:  Results for orders placed or performed during the hospital encounter of 01/27/23 (from the past 48 hour(s))  Comprehensive metabolic panel     Status: Abnormal   Collection Time: 01/27/23  3:29 AM  Result Value Ref Range   Sodium 136 135 - 145 mmol/L   Potassium 3.2 (L) 3.5 - 5.1 mmol/L   Chloride 105 98 - 111 mmol/L   CO2 24 22 - 32 mmol/L   Glucose, Bld 155 (H) 70 - 99 mg/dL    Comment: Glucose reference range applies only to samples taken after fasting for at least 8 hours.   BUN 11 6 - 20 mg/dL   Creatinine, Ser 0.90 0.44 - 1.00 mg/dL   Calcium 8.5 (L) 8.9 - 10.3 mg/dL   Total Protein 7.7 6.5 - 8.1 g/dL   Albumin 4.4 3.5 - 5.0 g/dL   AST 29 15 - 41 U/L   ALT 17 0 - 44 U/L   Alkaline Phosphatase 68 38 - 126 U/L   Total  Bilirubin 0.5 0.3 - 1.2 mg/dL   GFR, Estimated >60 >60 mL/min    Comment: (NOTE) Calculated using the CKD-EPI Creatinine Equation (2021)    Anion gap 7 5 - 15    Comment: Performed at [facility], [address] [place], [place] [address]  Ethanol     Status: None   Collection Time: 01/27/23  3:29 AM  Result Value Ref Range   Alcohol, Ethyl (B) <10 <10 mg/dL    Comment: (NOTE) Lowest detectable limit for serum alcohol is 10 mg/dL.  For medical purposes only. Performed at [facility], [address] [place], [place] [id number]   Salicylate level     Status: Abnormal   Collection Time: 01/27/23  3:29 AM  Result Value Ref Range   Salicylate Lvl Q000111Q (L) 7.0 - 30.0 mg/dL    Comment: Performed at [facility], [address] [place], [place] [address]  Acetaminophen level  Status: Abnormal   Collection Time: 01/27/23  3:29 AM  Result Value Ref Range   Acetaminophen (Tylenol), Serum <10 (L) 10 - 30 ug/mL    Comment: (NOTE) Therapeutic concentrations vary significantly. A range of 10-30 ug/mL  may be an effective concentration for many patients. However, some  are best treated at concentrations outside of this range. Acetaminophen concentrations >150 ug/mL at 4 hours after ingestion  and >50 ug/mL at 12 hours after ingestion are often associated with  toxic reactions.  Performed at [facility], [address] [place], [place] [address]   cbc     Status: None   Collection Time: 01/27/23  3:29 AM  Result Value Ref Range   WBC 7.1 4.0 - 10.5 K/uL   RBC 4.18 3.87 - 5.11 MIL/uL   Hemoglobin 12.8 12.0 - 15.0 g/dL   HCT 38.7 36.0 - 46.0 %   MCV 92.6 80.0 - 100.0 fL   MCH 30.6 26.0 - 34.0 pg   MCHC 33.1 30.0 - 36.0 g/dL   RDW 12.3 11.5 - 15.5 %   Platelets 235 150 - 400 K/uL   nRBC 0.0 0.0 - 0.2 %    Comment: Performed at [facility], [address], [place], [place] [address]  Magnesium     Status: None   Collection  Time: 01/27/23  3:29 AM  Result Value Ref Range   Magnesium 1.9 1.7 - 2.4 mg/dL    Comment: Performed at [facility] [address], [place],  [address]  Urine Drug Screen, Qualitative     Status: None   Collection Time: 01/27/23  4:23 AM  Result Value Ref Range   Tricyclic, Ur Screen NONE DETECTED NONE DETECTED   Amphetamines, Ur Screen NONE DETECTED NONE DETECTED   MDMA (Ecstasy)Ur Screen NONE DETECTED NONE DETECTED   Cocaine Metabolite,Ur [place] NONE DETECTED NONE DETECTED   Opiate, Ur Screen NONE DETECTED NONE DETECTED   Phencyclidine (PCP) Ur S NONE DETECTED NONE DETECTED   Cannabinoid 50 Ng, Ur [place] NONE DETECTED NONE DETECTED   Barbiturates, Ur Screen NONE DETECTED NONE DETECTED   Benzodiazepine, Ur Scrn NONE DETECTED NONE DETECTED   Methadone Scn, Ur NONE DETECTED NONE DETECTED    Comment: (NOTE) Tricyclics + metabolites, urine    Cutoff 1000 ng/mL Amphetamines + metabolites, urine  Cutoff 1000 ng/mL MDMA (Ecstasy), urine              Cutoff 500 ng/mL Cocaine Metabolite, urine          Cutoff 300 ng/mL Opiate + metabolites, urine        Cutoff 300 ng/mL Phencyclidine (PCP), urine         Cutoff 25 ng/mL Cannabinoid, urine                 Cutoff 50 ng/mL Barbiturates + metabolites, urine  Cutoff 200 ng/mL Benzodiazepine, urine              Cutoff 200 ng/mL Methadone, urine                   Cutoff 300 ng/mL  The urine drug screen provides only a preliminary, unconfirmed analytical test result and should not be used for non-medical purposes. Clinical consideration and professional judgment should be applied to any positive drug screen result due to possible interfering substances. A more specific alternate chemical method must be used in order to obtain a confirmed analytical result. Gas chromatography / mass spectrometry (GC/MS) is the  preferred confirm atory method. Performed at [facility], [address] [place], [place] [address]   POC urine  preg, ED     Status: None   Collection Time: 01/27/23  4:26 AM  Result Value Ref Range   Preg Test, Ur NEGATIVE NEGATIVE    Comment:        THE SENSITIVITY OF THIS METHODOLOGY IS >24 mIU/mL   Acetaminophen level     Status: Abnormal   Collection Time: 01/27/23  7:25 AM  Result Value Ref Range   Acetaminophen (Tylenol), Serum <10 (L) 10 - 30 ug/mL    Comment: (NOTE) Therapeutic concentrations vary significantly. A range of 10-30 ug/mL  may be an effective concentration for many patients. However, some  are best treated at concentrations outside of this range. Acetaminophen concentrations >150 ug/mL at 4 hours after ingestion  and >50 ug/mL at 12 hours after ingestion are often associated with  toxic reactions.  Performed at [facility], [address] [place], [place] [address]     No current facility-administered medications for this encounter.   Current Outpatient Medications  Medication Sig Dispense Refill   hydrOXYzine (ATARAX) 25 MG tablet Take 1 tablet (25 mg total) by mouth 3 (three) times daily as needed for anxiety. 30 tablet 0   mirtazapine (REMERON) 15 MG tablet Take 1 tablet (15 mg total) by mouth at bedtime. 30 tablet 0   QUEtiapine (SEROQUEL) 100 MG tablet Take 1 tablet (100 mg total) by mouth at bedtime. 30 tablet 0   sertraline (ZOLOFT) 50 MG tablet Take 50 mg by mouth daily.      Musculoskeletal: Strength & Muscle Tone: within normal limits Gait & Station:  Did not observe Patient leans: N/A    Psychiatric Specialty Exam: Physical Exam Vitals and nursing note reviewed.  Constitutional:      Appearance: She is normal weight.     Comments: Sleepy   HENT:     Head: Normocephalic and atraumatic.     Nose: Nose normal.  Pulmonary:     Effort: Pulmonary effort is normal.  Musculoskeletal:        General: Normal range of motion.     Cervical back: Normal range of motion.  Neurological:     General: No focal deficit present.     Mental  Status: She is oriented to person, place, and time.  Psychiatric:        Attention and Perception: Perception normal. She does not perceive auditory or visual hallucinations.        Mood and Affect: Mood is anxious and depressed. Affect is flat and tearful.        Speech: Speech normal.        Behavior: Behavior is cooperative.        Thought Content: Thought content normal. Thought content is not paranoid or delusional. Thought content does not include homicidal or suicidal ideation.        Cognition and Memory: Cognition and memory normal.        Judgment: Judgment is impulsive.     Review of Systems  Blood pressure 108/66, pulse 86, temperature 98.6 F (37 C), temperature source Oral, resp. rate 16, height 4' (1.219 m), weight 55.3 kg, SpO2 100 %.Body mass index is 37.23 kg/m.  General Appearance: Casual  Eye Contact:   Kept eyes closed   Speech:  Clear and Coherent  Volume:  Normal  Mood:  Anxious and Depressed  Affect:  Congruent, Depressed, and Tearful  Thought Process:  Coherent  Orientation:  Full (Time, Place, and Person)  Thought Content:  WDL  Suicidal Thoughts:  No  Homicidal Thoughts:  No  Memory:  Recent;   Good  Judgement:  Poor  Insight:  Fair  Psychomotor Activity:  Normal  Concentration:  Concentration: Fair  Recall:  Good  Fund of Knowledge:  Good  Language:  Good  Akathisia:  No  Handed:  Right  AIMS (if indicated):     Assets:  Communication Skills Desire for Improvement Financial Resources/Insurance Housing Physical Health Social Support  ADL's:  Intact  Cognition:  WNL  Sleep:   Poor     Physical Exam: Physical Exam Vitals and nursing note reviewed.  Constitutional:      Appearance: She is normal weight.     Comments: Sleepy   HENT:     Head: Normocephalic and atraumatic.     Nose: Nose normal.  Pulmonary:     Effort: Pulmonary effort is normal.  Musculoskeletal:        General: Normal range of motion.     Cervical back: Normal  range of motion.  Neurological:     General: No focal deficit present.     Mental Status: She is oriented to person, place, and time.  Psychiatric:        Attention and Perception: Perception normal. She does not perceive auditory or visual hallucinations.        Mood and Affect: Mood is anxious and depressed. Affect is flat and tearful.        Speech: Speech normal.        Behavior: Behavior is cooperative.        Thought Content: Thought content normal. Thought content is not paranoid or delusional. Thought content does not include homicidal or suicidal ideation.        Cognition and Memory: Cognition and memory normal.        Judgment: Judgment is impulsive.    ROS Blood pressure 108/66, pulse 86, temperature 98.6 F (37 C), temperature source Oral, resp. rate 16, height 4' (1.219 m), weight 55.3 kg, SpO2 100 %. Body mass index is 37.23 kg/m.  Treatment Plan Summary: Daily contact with patient to assess and evaluate symptoms and progress in treatment, Medication management, and Plan Patient meets criteria for inpatient psychiatric admission for stabilization.   Plan reviewed with Dr. Jacelyn Grip, [place].   Disposition: Recommend psychiatric Inpatient admission when medically cleared. Supportive therapy provided about ongoing stressors.  Ronny Flurry, NP 01/27/2023 2:55 PM

## 2023-01-27 NOTE — ED Notes (Signed)
Patient reports that she intentionally took an overdose of her medications in an attempt to harm herself, approximately 2 zoloft and between 7-8 each of quetipine and mirtazapine.  Patient reports admission in January 2024 for self harm but states she does not feel that it was helpful.  Patient also reports having an increase in suicidal thoughts over the past few days.

## 2023-01-27 NOTE — BH Assessment (Signed)
Writer attempted to speak with patient. Patient was sleepy, drowsy and unable to be assessed. Will try back at later time.

## 2023-01-27 NOTE — ED Provider Notes (Signed)
I was asked to follow-up on acetaminophen level as well as an EKG.  Both are normal.  She is at the end of her 6-hour of status post ingestion.  Remains hemodynamically stable.  Cleared for psychiatry hold, IVC   Lucillie Garfinkel, MD 01/27/23 [phone]

## 2023-01-27 NOTE — ED Triage Notes (Signed)
Pt reports SI with attempted OD. Reports ingesting 700 mg of Seroquel, 100 mg of Zoloft, and 120 mg Remeron. Pt reports ingested about 0300 this AM. Reports hx of SI with attempt and need for psychiatric hospitalization. Pt reports feeling " out of it" and chest being tight. Pt calm and cooperative currently. Pt alert and oriented. Breathing unlabored speaking in full sentences.   RN spoke with Poison control regarding pt.Spoke with agent and recommendation as follows:  QTC prolongation and seizure risk. State agitation is common. Recommend Obs for 6 hours and 4 hour tylenol level. Recommend Mg level as well with cmp and cbc. If pt gets agitated or tremulous benzo's are recommended PRN.

## 2023-01-27 NOTE — ED Notes (Signed)
Spoke with poison control and they are closing out her case.

## 2023-01-27 NOTE — ED Provider Notes (Signed)
[facility] Provider Note    Event Date/Time   First MD Initiated Contact with Patient 01/27/23 [phone]     (approximate)   History   Ingestion and Suicide Attempt   HPI  Andrea Mccarthy is a 22 y.o. female who has a history of depression who presents for evaluation of intentional overdose.  She said that she thinks the Lexapro that she started taking a month ago has made her more suicidal so she tried to kill herself tonight because of "stress".  Doses of medication are documented in the nurse triage note but includes Seroquel, Zoloft, and Remeron.  The ingestion occurred approximately 3 AM.  She says she has been hospitalized in a psychiatric hospital before.  She said that she feels "out of it" but has no specific complaints or concerns at this time iand s mostly just sleepy.     Physical Exam   Triage Vital Signs: ED Triage Vitals  Enc Vitals Group     BP 01/27/23 0328 (!) 134/99     Pulse Rate 01/27/23 0328 (!) 128     Resp 01/27/23 0328 20     Temp 01/27/23 0328 98.1 F (36.7 C)     Temp Source 01/27/23 0328 Oral     SpO2 01/27/23 0328 98 %     Weight 01/27/23 0327 55.3 kg (122 lb)     Height 01/27/23 0327 1.219 m (4')     Head Circumference --      Peak Flow --      Pain Score 01/27/23 0339 2     Pain Loc --      Pain Edu? --      Excl. in [place]? --     Most recent vital signs: Vitals:   01/27/23 0328  BP: (!) 134/99  Pulse: (!) 128  Resp: 20  Temp: 98.1 F (36.7 C)  SpO2: 98%     General: Sleeping but awakens easily.  No distress. CV:  Good peripheral perfusion.  Borderline tachycardia initially, resolved after IV fluids. Resp:  Normal effort. Speaking easily and comfortably, no accessory muscle usage nor intercostal retractions.   Abd:  No distention.  Other:  Reports depression and suicidal ideation, states that she still wants to kill herself and needs help.   ED Results / Procedures / Treatments   Labs (all labs  ordered are listed, but only abnormal results are displayed) Labs Reviewed  COMPREHENSIVE METABOLIC PANEL - Abnormal; Notable for the following components:      Result Value   Potassium 3.2 (*)    Glucose, Bld 155 (*)    Calcium 8.5 (*)    All other components within normal limits  SALICYLATE LEVEL - Abnormal; Notable for the following components:   Salicylate Lvl Q000111Q (*)    All other components within normal limits  ACETAMINOPHEN LEVEL - Abnormal; Notable for the following components:   Acetaminophen (Tylenol), Serum <10 (*)    All other components within normal limits  ETHANOL  CBC  URINE DRUG SCREEN, QUALITATIVE ([facility] ONLY)  MAGNESIUM  ACETAMINOPHEN LEVEL  POC URINE PREG, ED     EKG  ED ECG REPORT I, Hinda Kehr, the attending physician, personally viewed and interpreted this ECG.  Date: 01/27/2023 EKG Time: 3:32 AM Rate: 118 Rhythm: Sinus tachycardia  QRS Axis: normal Intervals: normal ST/T Wave abnormalities: Non-specific ST segment / T-wave changes, but no clear evidence of acute ischemia. Narrative Interpretation: no definitive evidence of acute ischemia; does not  meet STEMI criteria.    PROCEDURES:  Critical Care performed: No  .1-3 Lead EKG Interpretation  Performed by: Hinda Kehr, MD Authorized by: Hinda Kehr, MD     Interpretation: normal     ECG rate:  87   ECG rate assessment: normal     Rhythm: sinus rhythm     Ectopy: none     Conduction: normal      MEDICATIONS ORDERED IN ED: Medications  lactated ringers bolus 1,000 mL (1,000 mLs Intravenous New Bag/Given 01/27/23 0518)     IMPRESSION / MDM / ASSESSMENT AND PLAN / ED COURSE  I reviewed the triage vital signs and the nursing notes.                              Differential diagnosis includes, but is not limited to, depression, bipolar disorder, substance-induced mood disorder.  Patient's presentation is most consistent with acute presentation with potential threat to life or  bodily function.  Labs/studies ordered: As per protocol, I ordered the following labs as part of the patient's medical and psychiatric evaluation:  CBC, CMP, ethanol level, acetaminophen level, salicylate level, urine drug screen, COVID swab. Interventions/Medications given: LR 1 L IV bolus [facility] Course my include additional interventions or labs/studies not listed above.)  Vital signs notable initially for tachycardia but improved after fluids.  Labs are within normal limits.  The recommendations from poison control are for total of 6 hours of observation and a 4-hour repeat of acetaminophen.  Repeat level of acetaminophen is at 7:15 AM.  Patient has been stable.  I put her under involuntary commitment.  After the medical clearance, she will need psychiatric consultation.  The patient is on the cardiac monitor to evaluate for evidence of arrhythmia and/or significant heart rate changes.       FINAL CLINICAL IMPRESSION(S) / ED DIAGNOSES   Final diagnoses:  Intentional overdose, initial encounter ([place])  Suicidal ideation     Rx / DC Orders   ED Discharge Orders     None        Note:  This document was prepared using Dragon voice recognition software and may include unintentional dictation errors.   Hinda Kehr, MD 01/27/23 [phone]

## 2023-01-27 NOTE — ED Notes (Signed)
VOL  

## 2023-01-27 NOTE — BH Assessment (Signed)
Comprehensive Clinical Assessment (CCA) Note  01/27/2023 Andrea Mccarthy [id number]  Andrea Mccarthy, 22 year old female who presents to [facility] ED involuntarily for treatment. Per triage note, Pt reports SI with attempted OD. Reports ingesting 700 mg of Seroquel, 100 mg of Zoloft, and 120 mg Remeron. Pt reports ingested about 0300 this AM. Reports hx of SI with attempt and need for psychiatric hospitalization. Pt reports feeling " out of it" and chest being tight. Pt calm and cooperative currently. Pt alert and oriented. Breathing unlabored speaking in full sentences. RN spoke with Poison control regarding pt. Spoke with agent and recommendation as follows: QTC prolongation and seizure risk. State agitation is common. Recommend Obs for 6 hours and 4 hour Tylenol level. Recommend Mg level as well with cmp and cbc. If pt gets agitated or tremulous benzo's are recommended PRN.   During TTS assessment pt presents alert and oriented x 4, restless but cooperative, and mood-congruent with affect. The pt does not appear to be responding to internal or external stimuli. Neither is the pt presenting with any delusional thinking. Pt verified the information provided to triage RN.   Pt identifies her main complaint to be that stress has been building up and she has not been given adequate resources. Patient reports she lives with her grandfather and they have a great relationship. Patient reports she is working at [employer] which has been somewhat stressful and causing some anxiety. Patient states she takes her medication as prescribed but feels as though the Zoloft that she has been taking for about a month has made her symptoms worse. "I feel more suicidal." Patient was admitted to [facility] in January 2024 where she spent several days; however, patient reports the stay was not helpful and the resources she was given were not sufficient. "I need a more intensive treatment" Patient reports she is being followed by [facility] but  only sees someone once a month. Pt denies using any illicit substances and alcohol. Pt denies current SI/HI/AH/VH.    Pt provided her mom [name] [phone] as a collateral contact. Mom reports patient's current anti-depressants are causing patient to feel suicidal. She also states that patient has been working too many hours and she does not get along well with her supervisor. Mom believes patient could be anxious about her gender transition, returning to school in the fall and possibly the testosterone that patient was taking could have an effect on her as well.    Per Christal, NP, pt is recommended for inpatient psychiatric admission.    Chief Complaint:  Chief Complaint  Patient presents with   Ingestion   Suicide Attempt   Visit Diagnosis: Major depressive disorder, recurrent severe, without psychosis    CCA Screening, Triage and Referral (STR)  Patient Reported Information How did you hear about [place]? Self  Referral name: No data recorded Referral phone number: No data recorded  Whom do you see for routine medical problems? No data recorded Practice/Facility Name: No data recorded Practice/Facility Phone Number: No data recorded Name of Contact: No data recorded Contact Number: No data recorded Contact Fax Number: No data recorded Prescriber Name: No data recorded Prescriber Address (if known): No data recorded  What Is the Reason for Your Visit/Call Today? Intentional overdose  How Long Has This Been Causing You Problems? > than 6 months  What Do You Feel Would Help You the Most Today? Treatment for Depression or other mood problem; Stress Management; Medication(s)   Have You Recently Been in  Any Inpatient Treatment (Hospital/Detox/Crisis Center/28-Day Program)? No data recorded Name/Location of Program/Hospital:No data recorded How Long Were You There? No data recorded When Were You Discharged? No data recorded  Have You Ever Received Services From [facility]  Before? No data recorded Who Do You See at [facility]? No data recorded  Have You Recently Had Any Thoughts About Hurting Yourself? Yes  Are You Planning to Commit Suicide/Harm Yourself At This time? No   Have you Recently Had Thoughts About [place]? No  Explanation: No data recorded  Have You Used Any Alcohol or Drugs in the Past 24 Hours? No  How Long Ago Did You Use Drugs or Alcohol? No data recorded What Did You Use and How Much? No data recorded  Do You Currently Have a Therapist/Psychiatrist? Yes  Name of Therapist/Psychiatrist: [facility]   Have You Been Recently Discharged From Any Office Practice or Programs? No  Explanation of Discharge From Practice/Program: none reported     CCA Screening Triage Referral Assessment Type of Contact: Face-to-Face  Is this Initial or Reassessment? No data recorded Date Telepsych consult ordered in [facility]:  No data recorded Time Telepsych consult ordered in [facility]:  No data recorded  Patient Reported Information Reviewed? No data recorded Patient Left Without Being Seen? No data recorded Reason for Not Completing Assessment: No data recorded  Collateral Involvement: MomMickel Baas [phone]   Does Patient Have a [place]? No data recorded Name and Contact of Legal Guardian: No data recorded If Minor and Not Living with Parent(s), Who has Custody? No data recorded Is CPS involved or ever been involved? Never  Is APS involved or ever been involved? Never   Patient Determined To Be At Risk for Harm To Self or Others Based on Review of Patient Reported Information or Presenting Complaint? Yes, for Self-Harm  Method: No Plan  Availability of Means: Has close by  Intent: Vague intent or NA  Notification Required: No need or identified person  Additional Information for Danger to Others Potential: Previous attempts  Additional Comments for Danger to Others Potential: Pt reported 2 suicide attempts  this week.  Are There Guns or Other Weapons in [place]? No  Types of Guns/Weapons: na  Are These Weapons Safely Secured?                            -- (na)  Who Could Verify You Are Able To Have These Secured: na  Do You Have any Outstanding Charges, Pending Court Dates, Parole/Probation? none reported- denied  Contacted To Inform of Risk of Harm To Self or Others: No data recorded  Location of Assessment: [facility] ED   Does Patient Present under Involuntary Commitment? Yes  IVC Papers Initial File Date: No data recorded  [place] of Residence: [place]   Patient Currently Receiving the Following Services: Medication Management; Individual Therapy   Determination of Need: Emergent (2 hours)   Options For Referral: ED Visit; Inpatient Hospitalization; Intensive Outpatient Therapy; Medication Management     CCA Biopsychosocial Intake/Chief Complaint:  No data recorded Current Symptoms/Problems: No data recorded  Patient Reported Schizophrenia/Schizoaffective Diagnosis in Past: No   Strengths: Patient is able to communicate and verbalize her needs.  Preferences: No data recorded Abilities: No data recorded  Type of Services Patient Feels are Needed: No data recorded  Initial Clinical Notes/Concerns: No data recorded  Mental Health Symptoms Depression:   Change in energy/activity; Difficulty Concentrating; Fatigue;  Hopelessness; Increase/decrease in appetite; Irritability; Sleep (too much or little); Tearfulness; Weight gain/loss; Worthlessness   Duration of Depressive symptoms:  Greater than two weeks   Mania:   None   Anxiety:    Difficulty concentrating; Irritability; Restlessness; Worrying   Psychosis:   None   Duration of Psychotic symptoms: No data recorded  Trauma:   Avoids reminders of event; Difficulty staying/falling asleep; Emotional numbing   Obsessions:   None   Compulsions:   None   Inattention:   N/A   Hyperactivity/Impulsivity:    N/A   Oppositional/Defiant Behaviors:   N/A   Emotional Irregularity:   Mood lability; Potentially harmful impulsivity   Other Mood/Personality Symptoms:   none    Mental Status Exam Appearance and self-care  Stature:   Average   Weight:   Average weight   Clothing:   Casual; Disheveled   Grooming:   Normal   Cosmetic use:   Age appropriate   Posture/gait:   Slumped   Motor activity:   Not Remarkable   Sensorium  Attention:   Normal   Concentration:   Anxiety interferes   Orientation:   X5   Recall/memory:   Normal   Affect and Mood  Affect:   Depressed; Flat   Mood:   Depressed; Dysphoric   Relating  Eye contact:   Avoided   Facial expression:   Depressed   Attitude toward examiner:   Cooperative   Thought and Language  Speech flow:  Clear and Coherent; Paucity   Thought content:   Appropriate to Mood and Circumstances   Preoccupation:   Suicide   Hallucinations:   None   Organization:  No data recorded  [employer] of Knowledge:   Average   Intelligence:   Average   Abstraction:   Functional   Judgement:   Impaired   Reality Testing:   Adequate   Insight:   Lacking   Decision Making:   Confused; Impulsive   Social Functioning  Social Maturity:   Impulsive   Social Judgement:   Heedless   Stress  Stressors:   Work; Museum/gallery curator; Illness; Transitions   Coping Ability:   Deficient supports; Overwhelmed; Exhausted   Skill Deficits:   Decision making   Supports:   Friends/Service system; Support needed     Religion:    Leisure/Recreation:    Exercise/Diet: Exercise/Diet Do You Have Any Trouble Sleeping?: Yes Explanation of Sleeping Difficulties: gets about 4 hours nightly per pt estimate   CCA Employment/Education Employment/Work Situation: Employment / Work Situation Employment Situation: Employed Work Stressors: Patient is working too many hours. Patient's Job has  Been Impacted by Current Illness: Yes Describe how Patient's Job has Been Impacted: stressed Has Patient ever Been in the [employer]?: No  Education: Education Did Physicist, medical?: Yes What Type of College Degree Do you Have?: none   CCA Family/Childhood History Family and Relationship History: Family history Marital status: Single Does patient have children?: No  Childhood History:  Childhood History By whom was/is the patient raised?: Grandparents, Mother  Child/Adolescent Assessment:     CCA Substance Use Alcohol/Drug Use: Alcohol / Drug Use Pain Medications: see MAR Prescriptions: see MAR Over the Counter: see MAR History of alcohol / drug use?: No history of alcohol / drug abuse                         ASAM's:  Six Dimensions of Multidimensional Assessment  Dimension 1:  Acute  Intoxication and/or Withdrawal Potential:      Dimension 2:  Biomedical Conditions and Complications:      Dimension 3:  Emotional, Behavioral, or Cognitive Conditions and Complications:     Dimension 4:  Readiness to Change:     Dimension 5:  Relapse, Continued use, or Continued Problem Potential:     Dimension 6:  Recovery/Living Environment:     ASAM Severity Score:    ASAM Recommended Level of Treatment:     Substance use Disorder (SUD)    Recommendations for Services/Supports/Treatments:    DSM5 Diagnoses: Patient Active Problem List   Diagnosis Date Noted   Intentional overdose ([place]) 01/27/2023   Suicidal ideation 01/27/2023   MDD (major depressive disorder), recurrent severe, without psychosis ([place]) 12/03/2022   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

## 2023-01-27 NOTE — ED Notes (Signed)
Pt eating lunch at this time. First food or drink patient has taken in this shift.

## 2023-01-27 NOTE — ED Notes (Signed)
Patient changed out into hospital scrubs at this time. This RN and Lattie Haw, EDT as witness. All belongings removed from pt and placed in hospital safe location.   Keys  Wallet  Phone  Wireless Radiographer, therapeutic 1 pair crocs

## 2023-01-27 NOTE — BH Assessment (Signed)
Patient is under review at [facility] and will be accepted tonight 01/27/23 after 7:30pm. Awaiting complete details.

## 2023-01-28 DIAGNOSIS — F332 Major depressive disorder, recurrent severe without psychotic features: Secondary | ICD-10-CM

## 2023-01-28 MED ORDER — MIRTAZAPINE 15 MG PO TABS
30.0000 mg | ORAL_TABLET | Freq: Every day | ORAL | Status: DC
  Administered 2023-01-28: 30 mg via ORAL
  Filled 2023-01-28: qty 2

## 2023-01-28 NOTE — Group Note (Signed)
Recreation Therapy Group Note   Group Topic:Healthy Support Systems  Group Date: 01/28/2023 Start Time: 1000 End Time: 1055 Facilitators: Vilma Prader, LRT, CTRS Location:  Craft Room  Group Description: Straw Bridge. Patients were given 10 plastic drinking straws and an equal length of masking tape. Using the materials provided, patients were instructed to build a free-standing bridge-like structure to suspend an everyday item (ex: deck of cards) off the floor or table surface. All materials were required to be used in Conservation officer, nature. LRT facilitated post-activity discussion reviewing the importance of having strong and healthy support systems in our lives. LRT discussed how the people in our lives serve as the tape and the book we placed on top of our straw structure are the stressors we face in daily life. LRT and pts discussed what happens in our life when things get too heavy for [place], and we don't have strong supports outside of the hospital. Pt shared 2 of their healthy supports aloud in the group.   Affect/Mood: Appropriate, Flat, and Sad   Participation Level: Moderate and Engaged   Participation Quality: Independent   Behavior: Appropriate and Cooperative   Speech/Thought Process: Coherent   Insight: Fair   Judgement: Fair    Modes of Intervention: Activity and Guided Discussion   Patient Response to Interventions:  Receptive   Education Outcome:  Acknowledges education   Clinical Observations/Individualized Feedback: Andrea Mccarthy was mostly active in their participation of session activities and group discussion. Pt identified "family" as her healthy support systems outside of the hospital. Pt minimally interacted with LRT and peers, however completed the activity as prompted. Pt made small discussion with peers over tattoos while in group.    Plan: Continue to engage patient in RT group sessions 2-3x/week.   Vilma Prader, LRT, CTRS 01/28/2023 11:17 AM

## 2023-01-28 NOTE — ED Notes (Signed)
Single belongings bag of pts belongings retrieved from locked area and transported with pt to admitted unit - [place] [facility].

## 2023-01-28 NOTE — Group Note (Signed)
[facility] LCSW Group Therapy Note   Group Date: 01/28/2023 Start Time: 1300 End Time: 1400  Type of Therapy/Topic:  Group Therapy:  Feelings about Diagnosis  Participation Level:  Did Not Attend    Description of Group:    This group will allow patients to explore their thoughts and feelings about diagnoses they have received. Patients will be guided to explore their level of understanding and acceptance of these diagnoses. Facilitator will encourage patients to process their thoughts and feelings about the reactions of others to their diagnosis, and will guide patients in identifying ways to discuss their diagnosis with significant others in their lives. This group will be process-oriented, with patients participating in exploration of their own experiences as well as giving and receiving support and challenge from other group members.   Therapeutic Goals: 1. Patient will demonstrate understanding of diagnosis as evidence by identifying two or more symptoms of the disorder:  2. Patient will be able to express two feelings regarding the diagnosis 3. Patient will demonstrate ability to communicate their needs through discussion and/or role plays  Summary of Patient Progress: X   Therapeutic Modalities:   Cognitive Behavioral Therapy Brief Therapy Feelings Identification    Shirl Harris, LCSW

## 2023-01-28 NOTE — Progress Notes (Signed)
Patient calm and pleasant during assessment denying SI/HI/AVH. Pt observed interacting appropriately with staff and peers on the unit. Pt compliant with medication administration per MD orders. Pt given education, support, and encouragement to be active in her treatment plan. Pt being monitored Q 15 minutes for safety per unit protocol, remains safe on the unit   

## 2023-01-28 NOTE — BHH Suicide Risk Assessment (Signed)
[facility] Admission Suicide Risk Assessment   Nursing information obtained from:  Patient Demographic factors:  Adolescent or young adult, Caucasian Current Mental Status:  Self-harm thoughts Loss Factors:  Financial problems / change in socioeconomic status Historical Factors:  Prior suicide attempts Risk Reduction Factors:  Employed, Living with another person, especially a relative  Total Time spent with patient: 45 minutes Principal Problem: MDD (major depressive disorder), recurrent severe, without psychosis ([place]) Diagnosis:  Principal Problem:   MDD (major depressive disorder), recurrent severe, without psychosis ([place])  Subjective Data: Patient seen and chart reviewed.  This is a 22 year old transmasculine young person who presented to the hospital after overdosing on medication.  Impulsive overdose of antidepressants.  Currently still endorses depression but denies active suicidal ideation.  Denies psychosis.  Good insight cooperative with treatment.  Continued Clinical Symptoms:  Alcohol Use Disorder Identification Test Final Score (AUDIT): 0 The "Alcohol Use Disorders Identification Test", Guidelines for Use in Primary Care, Second Edition.  World Pharmacologist [facility]). Score between 0-7:  no or low risk or alcohol related problems. Score between 8-15:  moderate risk of alcohol related problems. Score between 16-19:  high risk of alcohol related problems. Score 20 or above:  warrants further diagnostic evaluation for alcohol dependence and treatment.   CLINICAL FACTORS:   Severe Anxiety and/or Agitation Depression:   Impulsivity   Musculoskeletal: Strength & Muscle Tone: within normal limits Gait & Station: normal Patient leans: N/A  Psychiatric Specialty Exam:  Presentation  General Appearance:  Appropriate for Environment  Eye Contact: Fair  Speech: Clear and Coherent  Speech Volume: Normal  Handedness: Right   Mood and Affect   Mood: Euthymic  Affect: Appropriate   Thought Process  Thought Processes: Coherent  Descriptions of Associations:Intact  Orientation:Full (Time, Place and Person)  Thought Content:Logical  History of Schizophrenia/Schizoaffective disorder:No  Duration of Psychotic Symptoms:No data recorded Hallucinations:No data recorded Ideas of Reference:None  Suicidal Thoughts:No data recorded Homicidal Thoughts:No data recorded  Sensorium  Memory: Immediate Good; Remote Fair  Judgment: Fair  Insight: Fair   Community education officer  Concentration: Fair  Attention Span: Fair  Recall: [place] of Knowledge: Good  Language: Good   Psychomotor Activity  Psychomotor Activity:No data recorded  Assets  Assets: Desire for Improvement; Communication Skills; Leisure Time; Social Support; Housing   Sleep  Sleep:No data recorded   Physical Exam: Physical Exam Vitals and nursing note reviewed.  Constitutional:      Appearance: Normal appearance.  HENT:     Head: Normocephalic and atraumatic.     Mouth/Throat:     Pharynx: Oropharynx is clear.  Eyes:     Pupils: Pupils are equal, round, and reactive to light.  Cardiovascular:     Rate and Rhythm: Normal rate and regular rhythm.  Pulmonary:     Effort: Pulmonary effort is normal.     Breath sounds: Normal breath sounds.  Abdominal:     General: Abdomen is flat.     Palpations: Abdomen is soft.  Musculoskeletal:        General: Normal range of motion.  Skin:    General: Skin is warm and dry.  Neurological:     General: No focal deficit present.     Mental Status: She is alert. Mental status is at baseline.  Psychiatric:        Attention and Perception: Attention normal.        Mood and Affect: Mood is depressed.        Speech: Speech  normal.        Behavior: Behavior is cooperative.        Thought Content: Thought content normal. Thought content does not include suicidal ideation.        Cognition  and Memory: Cognition normal.        Judgment: Judgment is impulsive.    Review of Systems  Constitutional: Negative.   HENT: Negative.    Eyes: Negative.   Respiratory: Negative.    Cardiovascular: Negative.   Gastrointestinal: Negative.   Musculoskeletal: Negative.   Skin: Negative.   Neurological: Negative.   Psychiatric/Behavioral:  Positive for depression. Negative for hallucinations, substance abuse and suicidal ideas. The patient is nervous/anxious and has insomnia.    Blood pressure 136/83, pulse 96, temperature 98.6 F (37 C), temperature source Oral, resp. rate 18, height 4\' 11"  (1.499 m), weight 53.1 kg, SpO2 100 %. Body mass index is 23.63 kg/m.   COGNITIVE FEATURES THAT CONTRIBUTE TO RISK:  Thought constriction (tunnel vision)    SUICIDE RISK:   Mild:  Suicidal ideation of limited frequency, intensity, duration, and specificity.  There are no identifiable plans, no associated intent, mild dysphoria and related symptoms, good self-control (both objective and subjective assessment), few other risk factors, and identifiable protective factors, including available and accessible social support.  PLAN OF CARE: Reviewed medication management.  Suggest some changes to medication planning.  Engage in individual and group therapy and meet with treatment team tomorrow.  Ongoing assessment of dangerousness prior to discharge  I certify that inpatient services furnished can reasonably be expected to improve the patient's condition.   Alethia Berthold, MD 01/28/2023, 4:27 PM

## 2023-01-28 NOTE — Group Note (Signed)
Date:  01/28/2023 Time:  9:08 PM  Group Topic/Focus:  Wrap-Up Group:   The focus of this group is to help patients review their daily goal of treatment and discuss progress on daily workbooks.    Participation Level:  Active  Participation Quality:  Appropriate and Attentive  Affect:  Appropriate  Cognitive:  Alert and Appropriate  Insight: Good  Engagement in Group:  Improving  Modes of Intervention:  Clarification  Additional Comments:    Rejina Odle 01/28/2023, 9:08 PM

## 2023-01-28 NOTE — H&P (Signed)
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  Patient reports that about 3 in the morning he took an overdose of Zoloft Remeron and Seroquel.  Shortly thereafter he regretted it and brought himself to the emergency room.  Patient reports depressive symptoms going back to early adolescence but says they have been worse over the past few months.  Mood feels sad and anxious and down much of the time.  Does not sleep very well wakes up at night.  Appetite somewhat diminished.  Patient has been getting follow-up treatment at [facility] but complains of not having enough visits since discharge from behavioral health Hospital in January.  Patient has been prescribed Zoloft but feels like it makes her feel worse more agitated.  Low-dose Remeron and Seroquel.  Denies regular alcohol use.  Says that she stopped using cannabis mostly recently denies other drug use.  Major life stresses are related to work also some deaths in extended family.  Also feels like his mother hassles him about going back to college in ways that make him uncomfortable. Associated Signs/Symptoms: Depression Symptoms:  depressed mood, anhedonia, insomnia, difficulty concentrating, suicidal attempt, (Hypo) Manic Symptoms:  Impulsivity, Anxiety Symptoms:  Excessive Worry, Psychotic Symptoms:   None reported PTSD Symptoms: History of some traumatic experiences but has not identified as a diagnosis of PTSD Total Time spent  with patient: 30 minutes  Past Psychiatric History: Recent hospitalization at behavioral health Hospital in [place] also for an overdose.  No previous hospitalizations before that.  Has been on Zoloft and Prozac in his lifetime both of which were not effective and made him feel more agitated.  Denies any history of any manic symptoms.  Has a history of cutting in the past and has recently done some more of that.  Patient identifies as trans masculine but is not currently receiving transition hormones or other specific treatment.  Expresses frustration that the counselor at [facility] seemed unsupportive.  Is the patient at risk to self? Yes.    Has the patient been a risk to self in the past 6 months? Yes.    Has the patient been a risk to self within the distant past? No.  Is the patient a risk to others? No.  Has the patient been a risk to others in the past 6 months? No.  Has the patient been a risk to others within the distant past? No.   [place] Scale:  [place] Admission (Current) from 01/27/2023 in [facility] Most recent reading at 01/28/2023 12:00 AM ED from 01/27/2023 in [facility] Emergency Department at [facility] Most recent reading at 01/27/2023  3:40 AM Admission (Discharged) from 12/03/2022 in [facility] Most recent reading at 12/03/2022 11:15 PM  C-SSRS RISK CATEGORY High Risk High Risk High Risk        Prior Inpatient Therapy: Yes.   If yes, describe just one recently in [place] Prior Outpatient Therapy: Yes.   If yes, describe has had outpatient treatment most recently through [facility]  Alcohol Screening: 1. How often do you have a drink containing alcohol?:  Never 2. How many drinks containing alcohol do you have on a typical day when you are drinking?: 1 or 2 3. How often do you have six or more drinks on one occasion?: Never AUDIT-C Score: 0 4. How often during the last year have you found that you were not  able to stop drinking once you had started?: Never 5. How often during the last year have you failed to do what was normally expected from you because of drinking?: Never 6. How often during the last year have you needed a first drink in the morning to get yourself going after a heavy drinking session?: Never 7. How often during the last year have you had a feeling of guilt of remorse after drinking?: Never 8. How often during the last year have you been unable to remember what happened the night before because you had been drinking?: Never 9. Have you or someone else been injured as a result of your drinking?: No 10. Has a relative or friend or a doctor or another health worker been concerned about your drinking or suggested you cut down?: No Alcohol Use Disorder Identification Test Final Score (AUDIT): 0 Alcohol Brief Interventions/Follow-up: Alcohol education/Brief advice Substance Abuse History in the last 12 months:  No. Consequences of Substance Abuse: Negative Previous Psychotropic Medications: Yes  Psychological Evaluations: Yes  Past Medical History:  Past Medical History:  Diagnosis Date   IBS (irritable bowel syndrome)    Migraine    History reviewed. No pertinent surgical history. Family History:  Family History  Family history unknown: Yes   Family Psychiatric  History: Positive family history of depression no known suicides. Tobacco Screening:  Social History   Tobacco Use  Smoking Status Never  Smokeless Tobacco Never    BH Tobacco Counseling     Are you interested in Tobacco Cessation Medications?  No value filed. Counseled patient on smoking cessation:  No value filed. Reason Tobacco Screening Not Completed: No value filed.       Social History:  Social History   Substance and Sexual Activity  Alcohol Use No     Social History   Substance and Sexual Activity  Drug Use Never    Additional Social History:                            Allergies:  No Known Allergies Lab Results:  Results for orders placed or performed during the hospital encounter of 01/27/23 (from the past 48 hour(s))  Comprehensive metabolic panel     Status: Abnormal   Collection Time: 01/27/23  3:29 AM  Result Value Ref Range   Sodium 136 135 - 145 mmol/L   Potassium 3.2 (L) 3.5 - 5.1 mmol/L   Chloride 105 98 - 111 mmol/L   CO2 24 22 - 32 mmol/L   Glucose, Bld 155 (H) 70 - 99 mg/dL    Comment: Glucose reference range applies only to samples taken after fasting for at least 8 hours.   BUN 11 6 - 20 mg/dL   Creatinine, Ser 0.90 0.44 - 1.00 mg/dL   Calcium 8.5 (L) 8.9 - 10.3 mg/dL   Total Protein 7.7 6.5 - 8.1 g/dL   Albumin 4.4 3.5 - 5.0 g/dL   AST 29 15 - 41 U/L   ALT 17 0 - 44 U/L   Alkaline Phosphatase 68 38 - 126 U/L   Total Bilirubin 0.5 0.3 - 1.2 mg/dL   GFR, Estimated >  60 >60 mL/min    Comment: (NOTE) Calculated using the CKD-EPI Creatinine Equation (2021)    Anion gap 7 5 - 15    Comment: Performed at [facility], [address] [place], [place] [address]  Ethanol     Status: None   Collection Time: 01/27/23  3:29 AM  Result Value Ref Range   Alcohol, Ethyl (B) <10 <10 mg/dL    Comment: (NOTE) Lowest detectable limit for serum alcohol is 10 mg/dL.  For medical purposes only. Performed at [facility], [address] [place], [place] [id number]   Salicylate level     Status: Abnormal   Collection Time: 01/27/23  3:29 AM  Result Value Ref Range   Salicylate Lvl [id number] (L) 7.0 - 30.0 mg/dL    Comment: Performed at [facility], [address] [place], [place] [address]  Acetaminophen level     Status: Abnormal   Collection Time: 01/27/23  3:29 AM  Result Value Ref Range   Acetaminophen (Tylenol), Serum <10 (L) 10 - 30 ug/mL    Comment: (NOTE) Therapeutic concentrations vary significantly. A range of 10-30 ug/mL  may be an effective concentration for many patients. However, some  are best  treated at concentrations outside of this range. Acetaminophen concentrations >150 ug/mL at 4 hours after ingestion  and >50 ug/mL at 12 hours after ingestion are often associated with  toxic reactions.  Performed at [facility], [address] [place], [place] [address]   cbc     Status: None   Collection Time: 01/27/23  3:29 AM  Result Value Ref Range   WBC 7.1 4.0 - 10.5 K/uL   RBC 4.18 3.87 - 5.11 MIL/uL   Hemoglobin 12.8 12.0 - 15.0 g/dL   HCT 38.7 36.0 - 46.0 %   MCV 92.6 80.0 - 100.0 fL   MCH 30.6 26.0 - 34.0 pg   MCHC 33.1 30.0 - 36.0 g/dL   RDW 12.3 11.5 - 15.5 %   Platelets 235 150 - 400 K/uL   nRBC 0.0 0.0 - 0.2 %    Comment: Performed at [facility], [address] [place], [place] [address]  Magnesium     Status: None   Collection Time: 01/27/23  3:29 AM  Result Value Ref Range   Magnesium 1.9 1.7 - 2.4 mg/dL    Comment: Performed at [facility], [address], [place],  [address]  Urine Drug Screen, Qualitative     Status: None   Collection Time: 01/27/23  4:23 AM  Result Value Ref Range   Tricyclic, Ur Screen NONE DETECTED NONE DETECTED   Amphetamines, Ur Screen NONE DETECTED NONE DETECTED   MDMA (Ecstasy)Ur Screen NONE DETECTED NONE DETECTED   Cocaine Metabolite,Ur [place] NONE DETECTED NONE DETECTED   Opiate, Ur Screen NONE DETECTED NONE DETECTED   Phencyclidine (PCP) Ur S NONE DETECTED NONE DETECTED   Cannabinoid 50 Ng, Ur [place] NONE DETECTED NONE DETECTED   Barbiturates, Ur Screen NONE DETECTED NONE DETECTED   Benzodiazepine, Ur Scrn NONE DETECTED NONE DETECTED   Methadone Scn, Ur NONE DETECTED NONE DETECTED    Comment: (NOTE) Tricyclics + metabolites, urine    Cutoff 1000 ng/mL Amphetamines + metabolites, urine  Cutoff 1000 ng/mL MDMA (Ecstasy), urine              Cutoff 500 ng/mL Cocaine Metabolite, urine          Cutoff 300 ng/mL Opiate + metabolites, urine  Cutoff 300 ng/mL Phencyclidine (PCP), urine          Cutoff 25 ng/mL Cannabinoid, urine                 Cutoff 50 ng/mL Barbiturates + metabolites, urine  Cutoff 200 ng/mL Benzodiazepine, urine              Cutoff 200 ng/mL Methadone, urine                   Cutoff 300 ng/mL  The urine drug screen provides only a preliminary, unconfirmed analytical test result and should not be used for non-medical purposes. Clinical consideration and professional judgment should be applied to any positive drug screen result due to possible interfering substances. A more specific alternate chemical method must be used in order to obtain a confirmed analytical result. Gas chromatography / mass spectrometry (GC/MS) is the preferred confirm atory method. Performed at [facility], [address] [place], [place] [address]   POC urine preg, ED     Status: None   Collection Time: 01/27/23  4:26 AM  Result Value Ref Range   Preg Test, Ur NEGATIVE NEGATIVE    Comment:        THE SENSITIVITY OF THIS METHODOLOGY IS >24 mIU/mL   Acetaminophen level     Status: Abnormal   Collection Time: 01/27/23  7:25 AM  Result Value Ref Range   Acetaminophen (Tylenol), Serum <10 (L) 10 - 30 ug/mL    Comment: (NOTE) Therapeutic concentrations vary significantly. A range of 10-30 ug/mL  may be an effective concentration for many patients. However, some  are best treated at concentrations outside of this range. Acetaminophen concentrations >150 ug/mL at 4 hours after ingestion  and >50 ug/mL at 12 hours after ingestion are often associated with  toxic reactions.  Performed at [facility], [address] [place], [place] [address]   Resp panel by RT-PCR (RSV, Flu A&B, Covid) Anterior Nasal Swab     Status: None   Collection Time: 01/27/23  2:00 PM   Specimen: Anterior Nasal Swab  Result Value Ref Range   SARS Coronavirus 2 by RT PCR NEGATIVE NEGATIVE    Comment: (NOTE) SARS-CoV-2 target nucleic acids are NOT DETECTED.  The SARS-CoV-2 RNA is  generally detectable in upper respiratory specimens during the acute phase of infection. The lowest concentration of SARS-CoV-2 viral copies this assay can detect is 138 copies/mL. A negative result does not preclude SARS-Cov-2 infection and should not be used as the sole basis for treatment or other patient management decisions. A negative result may occur with  improper specimen collection/handling, submission of specimen other than nasopharyngeal swab, presence of viral mutation(s) within the areas targeted by this assay, and inadequate number of viral copies(<138 copies/mL). A negative result must be combined with clinical observations, patient history, and epidemiological information. The expected result is Negative.  Fact Sheet for Patients:  EntrepreneurPulse.com.au  Fact Sheet for Healthcare Providers:  IncredibleEmployment.be  This test is no t yet approved or cleared by the [place] FDA and  has been authorized for detection and/or diagnosis of SARS-CoV-2 by FDA under an Emergency Use Authorization (EUA). This EUA will remain  in effect (meaning this test can be used) for the duration of the COVID-19 declaration under Section 564(b)(1) of the Act, 21 U.S.C.section 360bbb-3(b)(1), unless the authorization is terminated  or revoked sooner.       Influenza A by PCR NEGATIVE NEGATIVE   Influenza B  by PCR NEGATIVE NEGATIVE    Comment: (NOTE) The Xpert Xpress SARS-CoV-2/FLU/RSV plus assay is intended as an aid in the diagnosis of influenza from Nasopharyngeal swab specimens and should not be used as a sole basis for treatment. Nasal washings and aspirates are unacceptable for Xpert Xpress SARS-CoV-2/FLU/RSV testing.  Fact Sheet for Patients: EntrepreneurPulse.com.au  Fact Sheet for Healthcare Providers: IncredibleEmployment.be  This test is not yet approved or cleared by the [place] FDA  and has been authorized for detection and/or diagnosis of SARS-CoV-2 by FDA under an Emergency Use Authorization (EUA). This EUA will remain in effect (meaning this test can be used) for the duration of the COVID-19 declaration under Section 564(b)(1) of the Act, 21 U.S.C. section 360bbb-3(b)(1), unless the authorization is terminated or revoked.     Resp Syncytial Virus by PCR NEGATIVE NEGATIVE    Comment: (NOTE) Fact Sheet for Patients: EntrepreneurPulse.com.au  Fact Sheet for Healthcare Providers: IncredibleEmployment.be  This test is not yet approved or cleared by the [place] FDA and has been authorized for detection and/or diagnosis of SARS-CoV-2 by FDA under an Emergency Use Authorization (EUA). This EUA will remain in effect (meaning this test can be used) for the duration of the COVID-19 declaration under Section 564(b)(1) of the Act, 21 U.S.C. section 360bbb-3(b)(1), unless the authorization is terminated or revoked.  Performed at [facility], [address] [place], [place] [address]     Blood Alcohol level:  Lab Results  Component Value Date   [facility] <10 01/27/2023   ETH <10 [id number]    Metabolic Disorder Labs:  Lab Results  Component Value Date   HGBA1C 4.9 12/03/2022   MPG 93.93 12/03/2022   No results found for: "PROLACTIN" Lab Results  Component Value Date   CHOL 168 12/03/2022   TRIG 33 12/03/2022   HDL 66 12/03/2022   CHOLHDL 2.5 12/03/2022   VLDL 7 12/03/2022   LDLCALC 95 12/03/2022    Current Medications: Current Facility-Administered Medications  Medication Dose Route Frequency Provider Last Rate Last Admin   acetaminophen (TYLENOL) tablet 650 mg  650 mg Oral Q6H PRN Bennett, Christal H, NP       alum & mag hydroxide-simeth (MAALOX/MYLANTA) 200-200-20 MG/5ML suspension 30 mL  30 mL Oral Q4H PRN Bennett, Christal H, NP       diphenhydrAMINE (BENADRYL) capsule 50 mg  50 mg Oral BID PRN  Bennett, Christal H, NP       Or   diphenhydrAMINE (BENADRYL) injection 50 mg  50 mg Intramuscular BID PRN Bennett, Christal H, NP       haloperidol (HALDOL) tablet 5 mg  5 mg Oral BID PRN Bennett, Christal H, NP       Or   haloperidol lactate (HALDOL) injection 5 mg  5 mg Intramuscular BID PRN Bennett, Christal H, NP       LORazepam (ATIVAN) tablet 2 mg  2 mg Oral BID PRN Bennett, Christal H, NP       Or   LORazepam (ATIVAN) injection 2 mg  2 mg Intramuscular BID PRN Bennett, Christal H, NP       magnesium hydroxide (MILK OF MAGNESIA) suspension 30 mL  30 mL Oral Daily PRN Bennett, Christal H, NP       mirtazapine (REMERON) tablet 30 mg  30 mg Oral QHS Kameelah Minish T, MD       PTA Medications: Medications Prior to Admission  Medication Sig Dispense Refill Last Dose   hydrOXYzine (ATARAX) 25 MG tablet  Take 1 tablet (25 mg total) by mouth 3 (three) times daily as needed for anxiety. 30 tablet 0    mirtazapine (REMERON) 15 MG tablet Take 1 tablet (15 mg total) by mouth at bedtime. 30 tablet 0    QUEtiapine (SEROQUEL) 100 MG tablet Take 1 tablet (100 mg total) by mouth at bedtime. 30 tablet 0    sertraline (ZOLOFT) 50 MG tablet Take 50 mg by mouth daily.       Musculoskeletal: Strength & Muscle Tone: within normal limits Gait & Station: normal Patient leans: N/A            Psychiatric Specialty Exam:  Presentation  General Appearance:  Appropriate for Environment  Eye Contact: Fair  Speech: Clear and Coherent  Speech Volume: Normal  Handedness: Right   Mood and Affect  Mood: Euthymic  Affect: Appropriate   Thought Process  Thought Processes: Coherent  Duration of Psychotic Symptoms: No psychotic symptoms Past Diagnosis of Schizophrenia or Psychoactive disorder: No  Descriptions of Associations:Intact  Orientation:Full (Time, Place and Person)  Thought Content:Logical  Hallucinations:No data recorded Ideas of Reference:None  Suicidal  Thoughts:No data recorded Homicidal Thoughts:No data recorded  Sensorium  Memory: Immediate Good; Remote Fair  Judgment: Fair  Insight: Fair   Community education officer  Concentration: Fair  Attention Span: Fair  Recall: [place] of Knowledge: Good  Language: Good   Psychomotor Activity  Psychomotor Activity:No data recorded  Assets  Assets: Desire for Improvement; Communication Skills; Leisure Time; Social Support; Housing   Sleep  Sleep:No data recorded   Physical Exam: Physical Exam Vitals and nursing note reviewed.  Constitutional:      Appearance: Normal appearance.  HENT:     Head: Normocephalic and atraumatic.     Mouth/Throat:     Pharynx: Oropharynx is clear.  Eyes:     Pupils: Pupils are equal, round, and reactive to light.  Cardiovascular:     Rate and Rhythm: Normal rate and regular rhythm.  Pulmonary:     Effort: Pulmonary effort is normal.     Breath sounds: Normal breath sounds.  Abdominal:     General: Abdomen is flat.     Palpations: Abdomen is soft.  Musculoskeletal:        General: Normal range of motion.  Skin:    General: Skin is warm and dry.  Neurological:     General: No focal deficit present.     Mental Status: She is alert. Mental status is at baseline.  Psychiatric:        Attention and Perception: Attention normal.        Mood and Affect: Mood is anxious and depressed.        Speech: Speech normal.        Behavior: Behavior is cooperative.        Thought Content: Thought content normal.        Cognition and Memory: Cognition normal.        Judgment: Judgment is impulsive.    Review of Systems  Constitutional: Negative.   HENT: Negative.    Eyes: Negative.   Respiratory: Negative.    Cardiovascular: Negative.   Gastrointestinal: Negative.   Musculoskeletal: Negative.   Skin: Negative.   Neurological: Negative.   Psychiatric/Behavioral:  Positive for depression. Negative for hallucinations, substance abuse  and suicidal ideas. The patient is nervous/anxious and has insomnia.    Blood pressure 136/83, pulse 96, temperature 98.6 F (37 C), temperature source Oral, resp. rate 18, height 4\' 11"  (  1.499 m), weight 53.1 kg, SpO2 100 %. Body mass index is 23.63 kg/m.  Treatment Plan Summary: Daily contact with patient to assess and evaluate symptoms and progress in treatment, Medication management, and Plan proposed to patient that based on his history we not restart the Zoloft but rather maximize the Remeron.  Increase Remeron to 30 mg at night.  She is agreeable to the plan.  Continue 15-minute checks.  Treatment team will meet with him tomorrow.  Ongoing assessment of dangerousness prior to discharge.  Observation Level/Precautions:  15 minute checks  Laboratory:  Chemistry Profile  Psychotherapy:    Medications:    Consultations:    Discharge Concerns:    Estimated LOS:  Other:     Physician Treatment Plan for Primary Diagnosis: MDD (major depressive disorder), recurrent severe, without psychosis ([place]) Long Term Goal(s): Improvement in symptoms so as ready for discharge  Short Term Goals: Ability to verbalize feelings will improve and Ability to disclose and discuss suicidal ideas  Physician Treatment Plan for Secondary Diagnosis: Principal Problem:   MDD (major depressive disorder), recurrent severe, without psychosis ([place])  Long Term Goal(s): Improvement in symptoms so as ready for discharge  Short Term Goals: Compliance with prescribed medications will improve  I certify that inpatient services furnished can reasonably be expected to improve the patient's condition.    Alethia Berthold, MD 3/19/20244:30 PM

## 2023-01-28 NOTE — Group Note (Signed)
Date:  01/28/2023 Time:  5:48 PM  Group Topic/Focus:  Community Meeting    Participation Level:  Did Not Attend   [name] [facility] 01/28/2023, 5:48 PM

## 2023-01-28 NOTE — Tx Team (Signed)
Initial Treatment Plan 01/28/2023 12:37 AM [name] [id number]    PATIENT STRESSORS: Financial difficulties   Occupational concerns     PATIENT STRENGTHS: Average or above average intelligence  Capable of independent living  Communication skills    PATIENT IDENTIFIED PROBLEMS: Financial Concerns  Work life balance  Family problems                 DISCHARGE CRITERIA:  Improved stabilization in mood, thinking, and/or behavior  PRELIMINARY DISCHARGE PLAN: Outpatient therapy  PATIENT/FAMILY INVOLVEMENT: This treatment plan has been presented to and reviewed with the patient, Andrea Mccarthy, and/or family member,  .  The patient and family have been given the opportunity to ask questions and make suggestions.  Floyde Parkins, RN 01/28/2023, 12:37 AM
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
## 2023-01-28 NOTE — Group Note (Signed)
Date:  01/28/2023 Time:  6:30 PM  Group Topic/Focus:  Outside time, music group, motion group   Participation Level:  Did Not Attend  Participation Quality:  Ladona Mow 01/28/2023, 6:30 PM

## 2023-01-28 NOTE — Progress Notes (Signed)
Patient admitted to unit, reports really tired, was waken out of sleep. Admitted for SI attempt. OD on prescription medication. Patient currently denies any SI at present. Reports stressors are work life balance, financial, and financial. Skin and contraband search completed. Patient has tattoos to bilateral arms as well as superficial scratches to Left arm, scattered bruising to bilateral legs. No contraband found. Fluid and nutrition offered. Oriented patient to room and unit.  Encouragement and support provided. Safety checks maintained. Medications given as prescribed. Pt receptive and remains safe on unit with q 15 min checks.

## 2023-01-28 NOTE — Progress Notes (Signed)
D: Patient alert and oriented. Patient denies pain. Patient denies anxiety and depression. Patient denies SI/HI/AVH. Patient isolative to room during shift with exception to coming out for meals. Patient did not have medication scheduled during shift.  A:  Q15 minute safety checks maintained.   R: Patient remains safe on the unit at this time.

## 2023-01-29 DIAGNOSIS — F332 Major depressive disorder, recurrent severe without psychotic features: Secondary | ICD-10-CM | POA: Diagnosis not present

## 2023-01-29 LAB — BASIC METABOLIC PANEL
Anion gap: 6 (ref 5–15)
BUN: 10 mg/dL (ref 6–20)
CO2: 24 mmol/L (ref 22–32)
Calcium: 9.1 mg/dL (ref 8.9–10.3)
Chloride: 107 mmol/L (ref 98–111)
Creatinine, Ser: 0.78 mg/dL (ref 0.44–1.00)
GFR, Estimated: >60 mL/min (ref >60)
Glucose, Bld: 65 mg/dL — ABNORMAL LOW (ref 70–99)
Potassium: 3.8 mmol/L (ref 3.5–5.1)
Sodium: 137 mmol/L (ref 135–145)

## 2023-01-29 MED ORDER — HYDROCORTISONE 1 % EX CREA
TOPICAL_CREAM | Freq: Four times a day (QID) | CUTANEOUS | Status: DC | PRN
  Administered 2023-01-29: 1 via TOPICAL
  Filled 2023-01-29: qty 28

## 2023-01-29 MED ORDER — BUPROPION HCL ER (XL) 150 MG PO TB24
150.0000 mg | ORAL_TABLET | Freq: Every day | ORAL | Status: DC
  Administered 2023-01-30: 150 mg via ORAL
  Filled 2023-01-29: qty 1

## 2023-01-29 MED ORDER — TRAZODONE HCL 50 MG PO TABS
50.0000 mg | ORAL_TABLET | Freq: Every evening | ORAL | Status: DC | PRN
  Administered 2023-01-29 – 2023-01-30 (×2): 50 mg via ORAL
  Filled 2023-01-29 (×2): qty 1

## 2023-01-29 NOTE — Progress Notes (Signed)
Patient calm and pleasant during assessment denying SI/HI/AVH. Pt observed interacting appropriately with staff and peers on the unit. Pt compliant with medication administration per MD orders. Pt given education, support, and encouragement to be active in her treatment plan. Pt being monitored Q 15 minutes for safety per unit protocol, remains safe on the unit   

## 2023-01-29 NOTE — Progress Notes (Signed)
Pt denies SI/HI/AVH and verbally agrees to approach staff if these become apparent or before harming themselves/others. Rates depression 6/10. Rates anxiety 9/10. Rates pain 7/10. Pt Has been in the dayroom talking to another pt for most of the day. Pts goal is to get better coping skills and get on the right meds. Scheduled medications administered to pt, per MD orders. RN provided support and encouragement to pt. Q15 min safety checks implemented and continued. Pt is safe on the unit. Plan of care on going and no other concerns expressed at this time.  01/29/23 0708  Psych Admission Type (Psych Patients Only)  Admission Status Involuntary  Psychosocial Assessment  Patient Complaints Anxiety;Depression  Eye Contact Fair  Facial Expression Anxious;Sad  Affect Anxious;Depressed  Speech Logical/coherent;Soft  Interaction Assertive  Motor Activity Slow  Appearance/Hygiene Unremarkable  Behavior Characteristics Cooperative;Appropriate to situation;Calm  Mood Depressed;Anxious;Pleasant  Aggressive Behavior  Effect No apparent injury  Thought Process  Coherency WDL  Content Blaming others  Delusions None reported or observed  Perception WDL  Hallucination None reported or observed  Judgment Impaired  Confusion None  Danger to Self  Current suicidal ideation? Denies  Danger to Others  Danger to Others None reported or observed

## 2023-01-29 NOTE — Progress Notes (Signed)
Patient requested something PRN to help her sleep, NP contacted and PRN medication put in and administered.

## 2023-01-29 NOTE — Progress Notes (Signed)
[facility] MD Progress Note  01/29/2023 4:42 PM Andrea Mccarthy  MRN:  [id number] Subjective: Patient seen and chart reviewed.  22 year old with major depression and anxiety.  Patient attended treatment team.  Reported that there was a rash developing on the arms and that also a burning feeling in the mouth developed after taking the mirtazapine. Principal Problem: MDD (major depressive disorder), recurrent severe, without psychosis ([place]) Diagnosis: Principal Problem:   MDD (major depressive disorder), recurrent severe, without psychosis ([place])  Total Time spent with patient: 30 minutes  Past Psychiatric History: Past history of depression and has been seen at [facility].  Past suicidal ideation.  Past Medical History:  Past Medical History:  Diagnosis Date   IBS (irritable bowel syndrome)    Migraine    History reviewed. No pertinent surgical history. Family History:  Family History  Family history unknown: Yes   Family Psychiatric  History: See previous Social History:  Social History   Substance and Sexual Activity  Alcohol Use No     Social History   Substance and Sexual Activity  Drug Use Never    Social History   Socioeconomic History   Marital status: Single    Spouse name: Not on file   Number of children: Not on file   Years of education: Not on file   Highest education level: Not on file  Occupational History   Not on file  Tobacco Use   Smoking status: Never   Smokeless tobacco: Never  Vaping Use   Vaping Use: Never used  Substance and Sexual Activity   Alcohol use: No   Drug use: Never   Sexual activity: Not on file  Other Topics Concern   Not on file  Social History Narrative   Not on file   Social Determinants of Health   Financial Resource Strain: Not on file  Food Insecurity: No Food Insecurity (01/27/2023)   Hunger Vital Sign    Worried About Running Out of Food in the Last Year: Never true    Ran Out of Food in the Last Year: Never true   Transportation Needs: No Transportation Needs (01/27/2023)   PRAPARE - Hydrologist (Medical): No    Lack of Transportation (Non-Medical): No  Physical Activity: Not on file  Stress: Not on file  Social Connections: Not on file   Additional Social History:                         Sleep: Fair  Appetite:  Fair  Current Medications: Current Facility-Administered Medications  Medication Dose Route Frequency Provider Last Rate Last Admin   acetaminophen (TYLENOL) tablet 650 mg  650 mg Oral Q6H PRN Bennett, Christal H, NP   650 mg at 01/29/23 0623   alum & mag hydroxide-simeth (MAALOX/MYLANTA) 200-200-20 MG/5ML suspension 30 mL  30 mL Oral Q4H PRN Bennett, Christal H, NP       [START ON 01/30/2023] buPROPion (WELLBUTRIN XL) 24 hr tablet 150 mg  150 mg Oral Daily Tammala Weider T, MD       hydrocortisone cream 1 %   Topical QID PRN Gilmer Kaminsky T, MD       magnesium hydroxide (MILK OF MAGNESIA) suspension 30 mL  30 mL Oral Daily PRN Richardson Landry, Christal H, NP        Lab Results:  Results for orders placed or performed during the hospital encounter of 01/27/23 (from the past 48 hour(s))  Basic metabolic  panel     Status: Abnormal   Collection Time: 01/29/23 11:35 AM  Result Value Ref Range   Sodium 137 135 - 145 mmol/L   Potassium 3.8 3.5 - 5.1 mmol/L   Chloride 107 98 - 111 mmol/L   CO2 24 22 - 32 mmol/L   Glucose, Bld 65 (L) 70 - 99 mg/dL    Comment: Glucose reference range applies only to samples taken after fasting for at least 8 hours.   BUN 10 6 - 20 mg/dL   Creatinine, Ser 0.78 0.44 - 1.00 mg/dL   Calcium 9.1 8.9 - 10.3 mg/dL   GFR, Estimated >60 >60 mL/min    Comment: (NOTE) Calculated using the CKD-EPI Creatinine Equation (2021)    Anion gap 6 5 - 15    Comment: Performed at [facility], [address] [place], [place] [address]    Blood Alcohol level:  Lab Results  Component Value Date   [facility] <10 01/27/2023   ETH <10  [id number]    Metabolic Disorder Labs: Lab Results  Component Value Date   HGBA1C 4.9 12/03/2022   MPG 93.93 12/03/2022   No results found for: "PROLACTIN" Lab Results  Component Value Date   CHOL 168 12/03/2022   TRIG 33 12/03/2022   HDL 66 12/03/2022   CHOLHDL 2.5 12/03/2022   VLDL 7 12/03/2022   LDLCALC 95 12/03/2022    Physical Findings: AIMS: Facial and Oral Movements Muscles of Facial Expression: None, normal Lips and Perioral Area: None, normal Jaw: None, normal Tongue: None, normal,Extremity Movements Upper (arms, wrists, hands, fingers): None, normal Lower (legs, knees, ankles, toes): None, normal, Trunk Movements Neck, shoulders, hips: None, normal, Overall Severity Severity of abnormal movements (highest score from questions above): None, normal Incapacitation due to abnormal movements: None, normal Patient's awareness of abnormal movements (rate only patient's report): No Awareness, Dental Status Current problems with teeth and/or dentures?: No Does patient usually wear dentures?: No  CIWA:    COWS:     Musculoskeletal: Strength & Muscle Tone: within normal limits Gait & Station: normal Patient leans: N/A  Psychiatric Specialty Exam:  Presentation  General Appearance:  Appropriate for Environment  Eye Contact: Fair  Speech: Clear and Coherent  Speech Volume: Normal  Handedness: Right   Mood and Affect  Mood: Euthymic  Affect: Appropriate   Thought Process  Thought Processes: Coherent  Descriptions of Associations:Intact  Orientation:Full (Time, Place and Person)  Thought Content:Logical  History of Schizophrenia/Schizoaffective disorder:No  Duration of Psychotic Symptoms:No data recorded Hallucinations:No data recorded Ideas of Reference:None  Suicidal Thoughts:No data recorded Homicidal Thoughts:No data recorded  Sensorium  Memory: Immediate Good; Remote Fair  Judgment: Fair  Insight: Fair   Chemical engineer  Concentration: Fair  Attention Span: Fair  Recall: [place] of Knowledge: Good  Language: Good   Psychomotor Activity  Psychomotor Activity:No data recorded  Assets  Assets: Desire for Improvement; Communication Skills; Leisure Time; Social Support; Housing   Sleep  Sleep:No data recorded   Physical Exam: Physical Exam Vitals and nursing note reviewed.  Constitutional:      Appearance: Normal appearance.  HENT:     Head: Normocephalic and atraumatic.     Mouth/Throat:     Pharynx: Oropharynx is clear.  Eyes:     Pupils: Pupils are equal, round, and reactive to light.  Cardiovascular:     Rate and Rhythm: Normal rate and regular rhythm.  Pulmonary:     Effort: Pulmonary effort is normal.  Breath sounds: Normal breath sounds.  Abdominal:     General: Abdomen is flat.     Palpations: Abdomen is soft.  Musculoskeletal:        General: Normal range of motion.  Skin:    General: Skin is warm and dry.  Neurological:     General: No focal deficit present.     Mental Status: He is alert. Mental status is at baseline.  Psychiatric:        Attention and Perception: Attention normal.        Mood and Affect: Mood normal.        Speech: Speech normal.        Behavior: Behavior normal.        Thought Content: Thought content normal.        Cognition and Memory: Cognition normal.        Judgment: Judgment normal.    Review of Systems  Constitutional: Negative.   HENT: Negative.    Eyes: Negative.   Respiratory: Negative.    Cardiovascular: Negative.   Gastrointestinal: Negative.   Musculoskeletal: Negative.   Skin: Negative.   Neurological: Negative.   Psychiatric/Behavioral:  Positive for depression. Negative for suicidal ideas.    Blood pressure 117/82, pulse 89, temperature 98.3 F (36.8 C), temperature source Oral, resp. rate 20, height 4\' 11"  (1.499 m), weight 53.1 kg, SpO2 100 %. Body mass index is 23.63 kg/m.   Treatment Plan  Summary: Medication management and Plan no rash but the complaints sound like it could be a allergic reaction and there was nothing else I can identify besides mirtazapine that could have been a cause.  We will stop the mirtazapine and I have suggested we try Wellbutrin.  Even though anxiety is a major complaint this symptom can get better with mirtazapine as well.  Patient agreeable to plan.  Start 150 mg tomorrow.  Alethia Berthold, MD 01/29/2023, 4:42 PM

## 2023-01-29 NOTE — BH IP Treatment Plan (Signed)
Interdisciplinary Treatment and Diagnostic Plan Update  01/29/2023 Time of Session: [id number] Andrea Mccarthy MRN: [id number]  Principal Diagnosis: MDD (major depressive disorder), recurrent severe, without psychosis ([place])  Secondary Diagnoses: Principal Problem:   MDD (major depressive disorder), recurrent severe, without psychosis ([place])   Current Medications:  Current Facility-Administered Medications  Medication Dose Route Frequency Provider Last Rate Last Admin   acetaminophen (TYLENOL) tablet 650 mg  650 mg Oral Q6H PRN Bennett, Christal H, NP   650 mg at 01/29/23 [id number]   alum & mag hydroxide-simeth (MAALOX/MYLANTA) 200-200-20 MG/5ML suspension 30 mL  30 mL Oral Q4H PRN Bennett, Christal H, NP       hydrocortisone cream 1 %   Topical QID PRN Clapacs, Madie Reno, MD       magnesium hydroxide (MILK OF MAGNESIA) suspension 30 mL  30 mL Oral Daily PRN Bennett, Christal H, NP       PTA Medications: Medications Prior to Admission  Medication Sig Dispense Refill Last Dose   hydrOXYzine (ATARAX) 25 MG tablet Take 1 tablet (25 mg total) by mouth 3 (three) times daily as needed for anxiety. 30 tablet 0    mirtazapine (REMERON) 15 MG tablet Take 1 tablet (15 mg total) by mouth at bedtime. 30 tablet 0    QUEtiapine (SEROQUEL) 100 MG tablet Take 1 tablet (100 mg total) by mouth at bedtime. 30 tablet 0    sertraline (ZOLOFT) 50 MG tablet Take 50 mg by mouth daily.       Patient Stressors: Financial difficulties   Occupational concerns    Patient Strengths: Average or above average intelligence  Capable of independent living  Communication skills   Treatment Modalities: Medication Management, Group therapy, Case management,  1 to 1 session with clinician, Psychoeducation, Recreational therapy.   Physician Treatment Plan for Primary Diagnosis: MDD (major depressive disorder), recurrent severe, without psychosis ([place]) Long Term Goal(s): Improvement in symptoms so as ready for discharge   Short  Term Goals: Compliance with prescribed medications will improve Ability to verbalize feelings will improve Ability to disclose and discuss suicidal ideas  Medication Management: Evaluate patient's response, side effects, and tolerance of medication regimen.  Therapeutic Interventions: 1 to 1 sessions, Unit Group sessions and Medication administration.  Evaluation of Outcomes: Progressing  Physician Treatment Plan for Secondary Diagnosis: Principal Problem:   MDD (major depressive disorder), recurrent severe, without psychosis ([place])  Long Term Goal(s): Improvement in symptoms so as ready for discharge   Short Term Goals: Compliance with prescribed medications will improve Ability to verbalize feelings will improve Ability to disclose and discuss suicidal ideas     Medication Management: Evaluate patient's response, side effects, and tolerance of medication regimen.  Therapeutic Interventions: 1 to 1 sessions, Unit Group sessions and Medication administration.  Evaluation of Outcomes: Progressing   RN Treatment Plan for Primary Diagnosis: MDD (major depressive disorder), recurrent severe, without psychosis ([place]) Long Term Goal(s): Knowledge of disease and therapeutic regimen to maintain health will improve  Short Term Goals: Ability to remain free from injury will improve, Ability to verbalize frustration and anger appropriately will improve, Ability to demonstrate self-control, Ability to participate in decision making will improve, Ability to verbalize feelings will improve, Ability to disclose and discuss suicidal ideas, Ability to identify and develop effective coping behaviors will improve, and Compliance with prescribed medications will improve  Medication Management: RN will administer medications as ordered by provider, will assess and evaluate patient's response and provide education to patient for prescribed medication. RN will  report any adverse and/or side effects to  prescribing provider.  Therapeutic Interventions: 1 on 1 counseling sessions, Psychoeducation, Medication administration, Evaluate responses to treatment, Monitor vital signs and CBGs as ordered, Perform/monitor CIWA, COWS, AIMS and Fall Risk screenings as ordered, Perform wound care treatments as ordered.  Evaluation of Outcomes: Progressing   LCSW Treatment Plan for Primary Diagnosis: MDD (major depressive disorder), recurrent severe, without psychosis ([place]) Long Term Goal(s): Safe transition to appropriate next level of care at discharge, Engage patient in therapeutic group addressing interpersonal concerns.  Short Term Goals: Engage patient in aftercare planning with referrals and resources, Increase social support, Increase ability to appropriately verbalize feelings, Increase emotional regulation, Facilitate acceptance of mental health diagnosis and concerns, Facilitate patient progression through stages of change regarding substance use diagnoses and concerns, Identify triggers associated with mental health/substance abuse issues, and Increase skills for wellness and recovery  Therapeutic Interventions: Assess for all discharge needs, 1 to 1 time with Social worker, Explore available resources and support systems, Assess for adequacy in community support network, Educate family and significant other(s) on suicide prevention, Complete Psychosocial Assessment, Interpersonal group therapy.  Evaluation of Outcomes: Progressing   Progress in Treatment: Attending groups: Yes. Participating in groups: Yes. Taking medication as prescribed: Yes. Toleration medication: Yes. Family/Significant other contact made: No, will contact:  CSW to reach Judeth Porch, mother with written consent  Patient understands diagnosis: Yes. Discussing patient identified problems/goals with staff: Yes. Medical problems stabilized or resolved: Yes. Denies suicidal/homicidal ideation: No. Issues/concerns per  patient self-inventory: Yes. Other: none  New problem(s) identified: No, Describe:  none  New Short Term/Long Term Goal(s): Patient to work towards  medication management for mood stabilization; elimination of SI thoughts; development of comprehensive mental wellness plan.  Patient Goals:  Patient states their goal for treatment is to "get better outpatient care."  Discharge Plan or Barriers: No psychosocial barriers identified at this time, patient to return to place of residence when appropriate for discharge.   Reason for Continuation of Hospitalization: Depression Medication stabilization  Estimated Length of Stay: 1-7 days   Last 3 [place] Suicide Severity Risk Score: Flowsheet Row Admission (Current) from 01/27/2023 in [place] Most recent reading at 01/28/2023 12:00 AM ED from 01/27/2023 in [facility] Emergency Department [facility] Most recent reading at 01/27/2023  3:40 AM Admission (Discharged) from 12/03/2022 in [facility] Most recent reading at 12/03/2022 11:15 PM  C-SSRS RISK CATEGORY High Risk High Risk High Risk       Scribe for Treatment Team: Larose Kells 01/29/2023 10:27 AM

## 2023-01-29 NOTE — Plan of Care (Signed)
  Problem: Education: Goal: Knowledge of General Education information will improve Description: Including pain rating scale, medication(s)/side effects and non-pharmacologic comfort measures Outcome: Progressing   Problem: Activity: Goal: Risk for activity intolerance will decrease Outcome: Progressing   Problem: Nutrition: Goal: Adequate nutrition will be maintained Outcome: Progressing   Problem: Coping: Goal: Level of anxiety will decrease Outcome: Progressing   

## 2023-01-29 NOTE — Group Note (Signed)
Recreation Therapy Group Note   Group Topic:Problem Solving  Group Date: 01/29/2023 Start Time: 1000 End Time: 1055 Facilitators: Vilma Prader, LRT, CTRS Location:  Craft Room  Group Description: Life Boat. Patients were given the scenario that they are on a boat that is about to become shipwrecked, leaving them stranded on an [place]. They are asked to make a list of 15 different items that they want to take with them when they are stranded on the [place]. Patients are asked to rank their items from most important to least important, #1 being the most important and #15 being the least. Patients will work individually for the first round to come up with 15 items and then pair up with a peer(s) to condense their list and come up with one list of 15 items between the two of them. Patients or LRT will read aloud the 15 different items to the group after each round. LRT facilitated post-activity processing to discuss how this activity can be used in daily life post discharge.   Affect/Mood: Appropriate, Congruent, and Flat   Participation Level: Active and Engaged   Participation Quality: Independent   Behavior: Alert, Appropriate, Attentive , and Calm   Speech/Thought Process: Coherent   Insight: Moderate   Judgement: Moderate   Modes of Intervention: Activity and Guided Discussion   Patient Response to Interventions:  Attentive and Receptive   Education Outcome:  Acknowledges education   Clinical Observations/Individualized Feedback: Andrea Mccarthy was active in their participation of session activities and group discussion. Pt identified "a Nintendo switch, rechargeable battery, and matches" as some of the items they wants to bring on her. Pt provided good insight on why she chose to bring a video game and discussed how it could be used as a coping skill. Pt was soft spoken, however affect brightened when speaking with peers. Pt was appropriate duration of session.  Plan: Continue to  engage patient in RT group sessions 2-3x/week.   Vilma Prader, LRT, CTRS 01/29/2023 11:11 AM

## 2023-01-29 NOTE — Group Note (Signed)
Date:  01/29/2023 Time:  10:27 AM  Group Topic/Focus:  Community Meeting    Participation Level:  Active  Participation Quality:  Appropriate  Affect:  Appropriate  Cognitive:  Appropriate  Insight: Appropriate  Engagement in Group:  Engaged  Modes of Intervention:  Discussion, Education, and Support  Additional Comments:    [name] 01/29/2023, 10:27 AM

## 2023-01-29 NOTE — Group Note (Signed)
Date:  01/29/2023 Time:  5:27 PM  Group Topic/Focus:  Activity Group    Participation Level:  Active  Participation Quality:  Appropriate  Affect:  Appropriate  Cognitive:  Appropriate  Insight: Appropriate  Engagement in Group:  Engaged  Modes of Intervention:  Activity  Additional Comments:    [name] 01/29/2023, 5:27 PM

## 2023-01-29 NOTE — Group Note (Signed)
[facility] LCSW Group Therapy Note   Group Date: 01/29/2023 Start Time: 1300 End Time: 1400   Type of Therapy/Topic:  Group Therapy:  Emotion Regulation  Participation Level:  Active   Mood:  Description of Group:    The purpose of this group is to assist patients in learning to regulate negative emotions and experience positive emotions. Patients will be guided to discuss ways in which they have been vulnerable to their negative emotions. These vulnerabilities will be juxtaposed with experiences of positive emotions or situations, and patients challenged to use positive emotions to combat negative ones. Special emphasis will be placed on coping with negative emotions in conflict situations, and patients will process healthy conflict resolution skills.  Therapeutic Goals: Patient will identify two positive emotions or experiences to reflect on in order to balance out negative emotions:  Patient will label two or more emotions that they find the most difficult to experience:  Patient will be able to demonstrate positive conflict resolution skills through discussion or role plays:   Summary of Patient Progress: Patient was present in group.  Patient was an active participant.  Patient was engaged in group.  Patient was able to identify coping skills.  Patient displayed fair insight     Therapeutic Modalities:   Cognitive Behavioral Therapy Feelings Identification Dialectical Behavioral Therapy   Rozann Lesches, LCSW

## 2023-01-30 DIAGNOSIS — F332 Major depressive disorder, recurrent severe without psychotic features: Secondary | ICD-10-CM | POA: Diagnosis not present

## 2023-01-30 MED ORDER — ESCITALOPRAM OXALATE 10 MG PO TABS
5.0000 mg | ORAL_TABLET | Freq: Every day | ORAL | Status: DC
  Administered 2023-01-30 – 2023-01-31 (×2): 5 mg via ORAL
  Filled 2023-01-30 (×2): qty 1

## 2023-01-30 MED ORDER — HYDROXYZINE HCL 25 MG PO TABS
25.0000 mg | ORAL_TABLET | Freq: Four times a day (QID) | ORAL | Status: DC | PRN
  Administered 2023-01-30: 25 mg via ORAL
  Filled 2023-01-30: qty 1

## 2023-01-30 NOTE — [facility] Suicide Risk Assessment (Signed)
[place] INPATIENT:  Family/Significant Other Suicide Prevention Education  Suicide Prevention Education:  Education Completed; Ileene Hutchinson, grandfather, [phone]  has been identified by the patient as the family member/significant other with whom the patient will be residing, and identified as the person(s) who will aid the patient in the event of a mental health crisis (suicidal ideations/suicide attempt).  With written consent from the patient, the family member/significant other has been provided the following suicide prevention education, prior to the and/or following the discharge of the patient.  The suicide prevention education provided includes the following: Suicide risk factors Suicide prevention and interventions National Suicide Hotline telephone number [facility] assessment telephone number [facility] Emergency Assistance [place] and/or Residential Mobile Crisis Unit telephone number  Request made of family/significant other to: Remove weapons (e.g., guns, rifles, knives), all items previously/currently identified as safety concern.   Remove drugs/medications (over-the-counter, prescriptions, illicit drugs), all items previously/currently identified as a safety concern.  The family member/significant other verbalizes understanding of the suicide prevention education information provided.  The family member/significant other agrees to remove the items of safety concern listed above.   Grandfather reports that patient admitted herself to the hospital "at 3 am when I was asleep, I wish she would have woke me up but she didn't".  He reports that patient became injured at work "and when she gets hurt at work that brings her down".  He reports that patient was about to start her menstrual cycle and pain increases her anxiety and depression. He reports that patient has a conflictual relationship with her mother and step-father.  Rozann Lesches 01/30/2023, 4:08 PM

## 2023-01-30 NOTE — Plan of Care (Signed)

## 2023-01-30 NOTE — Group Note (Signed)
Date:  01/30/2023 Time:  9:40 AM  Group Topic/Focus:  Goals Group:   The focus of this group is to help patients establish daily goals to achieve during treatment and discuss how the patient can incorporate goal setting into their daily lives to aide in recovery. Community Meeting    Participation Level:  Active  Participation Quality:  Appropriate  Affect:  Appropriate  Cognitive:  Appropriate  Insight: Appropriate  Engagement in Group:  Engaged  Modes of Intervention:  Discussion, Education, and Support  Additional Comments:    [name] 01/30/2023, 9:40 AM

## 2023-01-30 NOTE — Progress Notes (Signed)
D- Patient alert and oriented x 3. Affect flat/mood preoccupied. Denies SI/HI/ AVH.  She denies pain but endorses depression and anxiety. A- Scheduled medications administered to patient, per MD orders. She began Lexapro 10mg  today for depression. Support and encouragement provided.  Routine safety checks conducted every 15 minutes.  Patient informed to notify staff with problems or concerns and verbalizes understanding. R- No adverse drug reactions noted. Patient compliant with medications and treatment plan. Patient receptive, calm, and cooperative and she interacts well with others on the unit. Patient contracts for safety and remains safe on the unit at this time.

## 2023-01-30 NOTE — [facility] Group Notes (Signed)
[place] Group Notes:  (Nursing/MHT/Case Management/Adjunct)  Date:  01/30/2023  Time:  8:40 PM  Type of Therapy:   Wrap up  Participation Level:  Active  Participation Quality:  Appropriate  Affect:  Appropriate  Cognitive:  Alert  Insight:  Good  Engagement in Group:  Engaged and said goals were met.  Modes of Intervention:  Support  Summary of Progress/Problems:  Andrea Mccarthy 01/30/2023, 8:40 PM

## 2023-01-30 NOTE — Group Note (Signed)
Recreation Therapy Group Note   Group Topic:Relaxation  Group Date: 01/30/2023 Start Time: 1000 End Time: 1050 Facilitators: Vilma Prader, LRT, CTRS Location:  Dayroom  Group Description: PMR (Progressive Muscle Relaxation). LRT asks patients their current level of stress/anxiety from 1-10, with 10 being the highest. LRT educates patients on what PMR is and the benefits that come from it. Patients are asked to sit with their feet flat on the floor while sitting up and all the way back in their chair, if possible. LRT follows prompt that requires the patients to tense and release different muscles in their body and focus on their breathing. During session, lights are off and soft music is being played. At the end of the prompt, LRT asks patients to rank their current levels of stress/anxiety from 1-10, 10 being the highest.   Affect/Mood: Appropriate   Participation Level: Active and Engaged   Participation Quality: Independent   Behavior: Alert, Appropriate, Calm, and Eager   Speech/Thought Process: Coherent   Insight: Good and Improved   Judgement: Good   Modes of Intervention: Activity and Education   Patient Response to Interventions:  Attentive, Engaged, Interested , and Receptive   Education Outcome:  Acknowledges education   Clinical Observations/Individualized Feedback: "Andrea Mccarthy"  was active in their participation of session activities and group discussion. Pt identified that their anxiety level was a 8 out of 10, with 10 being the highest and their stress level a 9. After the session, pt rated their anxiety level a 6 and their stress level a 7. Pt shared that they have done PMR in other facilities in previous admissions. Pt shared that this usually did not work for them, but that today they found it helpful. Pt received a stress ball after session.   Plan: Continue to engage patient in RT group sessions 2-3x/week.   Vilma Prader, LRT, CTRS 01/30/2023 11:14 AM

## 2023-01-30 NOTE — Group Note (Signed)
LCSW Group Therapy Note  Group Date: 01/30/2023 Start Time: 1300 End Time: 1400   Type of Therapy and Topic:  Group Therapy - Healthy vs Unhealthy Coping Skills  Participation Level:  Active   Description of Group The focus of this group was to determine what unhealthy coping techniques typically are used by group members and what healthy coping techniques would be helpful in coping with various problems. Patients were guided in becoming aware of the differences between healthy and unhealthy coping techniques. Patients were asked to identify 2-3 healthy coping skills they would like to learn to use more effectively.  Therapeutic Goals Patients learned that coping is what human beings do all day long to deal with various situations in their lives Patients defined and discussed healthy vs unhealthy coping techniques Patients identified their preferred coping techniques and identified whether these were healthy or unhealthy Patients determined 2-3 healthy coping skills they would like to become more familiar with and use more often. Patients provided support and ideas to each other   Summary of Patient Progress:  Patient was present for the entirety of the group session. Patient was an active listener and participated in the topic of discussion, provided helpful advice to others, and added nuance to topic of conversation.   Therapeutic Modalities Cognitive Behavioral Therapy Motivational Interviewing  Larose Kells 01/30/2023  2:27 PM

## 2023-01-30 NOTE — Progress Notes (Signed)
[facility] MD Progress Note  01/30/2023 2:36 PM Andrea Mccarthy  MRN:  [id number] Subjective: Follow-up patient with depression.  Patient reports mood is improved.  Denies any suicidal thought.  Patient does complain that they feel fatigued and feel like the Wellbutrin is causing that.  Behavior has been very good very participatory in groups.  Seems motivated for improvement. Principal Problem: MDD (major depressive disorder), recurrent severe, without psychosis ([place]) Diagnosis: Principal Problem:   MDD (major depressive disorder), recurrent severe, without psychosis ([place])  Total Time spent with patient: 30 minutes  Past Psychiatric History: Past history of depression and anxiety  Past Medical History:  Past Medical History:  Diagnosis Date   IBS (irritable bowel syndrome)    Migraine    History reviewed. No pertinent surgical history. Family History:  Family History  Family history unknown: Yes   Family Psychiatric  History: See previous Social History:  Social History   Substance and Sexual Activity  Alcohol Use No     Social History   Substance and Sexual Activity  Drug Use Never    Social History   Socioeconomic History   Marital status: Single    Spouse name: Not on file   Number of children: Not on file   Years of education: Not on file   Highest education level: Not on file  Occupational History   Not on file  Tobacco Use   Smoking status: Never   Smokeless tobacco: Never  Vaping Use   Vaping Use: Never used  Substance and Sexual Activity   Alcohol use: No   Drug use: Never   Sexual activity: Not on file  Other Topics Concern   Not on file  Social History Narrative   Not on file   Social Determinants of Health   Financial Resource Strain: Not on file  Food Insecurity: No Food Insecurity (01/27/2023)   Hunger Vital Sign    Worried About Running Out of Food in the Last Year: Never true    Ran Out of Food in the Last Year: Never true  Transportation  Needs: No Transportation Needs (01/27/2023)   PRAPARE - Hydrologist (Medical): No    Lack of Transportation (Non-Medical): No  Physical Activity: Not on file  Stress: Not on file  Social Connections: Not on file   Additional Social History:                         Sleep: Fair  Appetite:  Fair  Current Medications: Current Facility-Administered Medications  Medication Dose Route Frequency Provider Last Rate Last Admin   acetaminophen (TYLENOL) tablet 650 mg  650 mg Oral Q6H PRN Bennett, Christal H, NP   650 mg at 01/29/23 0623   alum & mag hydroxide-simeth (MAALOX/MYLANTA) 200-200-20 MG/5ML suspension 30 mL  30 mL Oral Q4H PRN Bennett, Christal H, NP       escitalopram (LEXAPRO) tablet 5 mg  5 mg Oral Daily Keiji Melland T, MD       hydrocortisone cream 1 %   Topical QID PRN Clemens Lachman, Madie Ren[place], MD   Given at 01/30/23 0815   magnesium hydroxide (MILK OF MAGNESIA) suspension 30 mL  30 mL Oral Daily PRN Bennett, Christal H, NP       traZODone (DESYREL) tablet 50 mg  50 mg Oral QHS PRN Caroline Sauger, NP   50 mg at 01/29/23 2231    Lab Results:  Results for orders placed or  performed during the hospital encounter of 01/27/23 (from the past 48 hour(s))  Basic metabolic panel     Status: Abnormal   Collection Time: 01/29/23 11:35 AM  Result Value Ref Range   Sodium 137 135 - 145 mmol/L   Potassium 3.8 3.5 - 5.1 mmol/L   Chloride 107 98 - 111 mmol/L   CO2 24 22 - 32 mmol/L   Glucose, Bld 65 (L) 70 - 99 mg/dL    Comment: Glucose reference range applies only to samples taken after fasting for at least 8 hours.   BUN 10 6 - 20 mg/dL   Creatinine, Ser 0.78 0.44 - 1.00 mg/dL   Calcium 9.1 8.9 - 10.3 mg/dL   GFR, Estimated >60 >60 mL/min    Comment: (NOTE) Calculated using the CKD-EPI Creatinine Equation (2021)    Anion gap 6 5 - 15    Comment: Performed at [facility], [address] [place], [place] [address]    Blood Alcohol  level:  Lab Results  Component Value Date   [facility] <10 01/27/2023   ETH <10 [id number]    Metabolic Disorder Labs: Lab Results  Component Value Date   HGBA1C 4.9 12/03/2022   MPG 93.93 12/03/2022   No results found for: "PROLACTIN" Lab Results  Component Value Date   CHOL 168 12/03/2022   TRIG 33 12/03/2022   HDL 66 12/03/2022   CHOLHDL 2.5 12/03/2022   VLDL 7 12/03/2022   LDLCALC 95 12/03/2022    Physical Findings: AIMS: Facial and Oral Movements Muscles of Facial Expression: None, normal Lips and Perioral Area: None, normal Jaw: None, normal Tongue: None, normal,Extremity Movements Upper (arms, wrists, hands, fingers): None, normal Lower (legs, knees, ankles, toes): None, normal, Trunk Movements Neck, shoulders, hips: None, normal, Overall Severity Severity of abnormal movements (highest score from questions above): None, normal Incapacitation due to abnormal movements: None, normal Patient's awareness of abnormal movements (rate only patient's report): No Awareness, Dental Status Current problems with teeth and/or dentures?: No Does patient usually wear dentures?: No  CIWA:    COWS:     Musculoskeletal: Strength & Muscle Tone: within normal limits Gait & Station: normal Patient leans: N/A  Psychiatric Specialty Exam:  Presentation  General Appearance:  Appropriate for Environment  Eye Contact: Fair  Speech: Clear and Coherent  Speech Volume: Normal  Handedness: Right   Mood and Affect  Mood: Euthymic  Affect: Appropriate   Thought Process  Thought Processes: Coherent  Descriptions of Associations:Intact  Orientation:Full (Time, Place and Person)  Thought Content:Logical  History of Schizophrenia/Schizoaffective disorder:No  Duration of Psychotic Symptoms:No data recorded Hallucinations:No data recorded Ideas of Reference:None  Suicidal Thoughts:No data recorded Homicidal Thoughts:No data recorded  Sensorium   Memory: Immediate Good; Remote Fair  Judgment: Fair  Insight: Fair   Community education officer  Concentration: Fair  Attention Span: Fair  Recall: [place] of Knowledge: Good  Language: Good   Psychomotor Activity  Psychomotor Activity:No data recorded  Assets  Assets: Desire for Improvement; Communication Skills; Leisure Time; Social Support; Housing   Sleep  Sleep:No data recorded   Physical Exam: Physical Exam Vitals reviewed.  Constitutional:      Appearance: Normal appearance.  HENT:     Head: Normocephalic and atraumatic.     Mouth/Throat:     Pharynx: Oropharynx is clear.  Eyes:     Pupils: Pupils are equal, round, and reactive to light.  Cardiovascular:     Rate and Rhythm: Normal rate and regular rhythm.  Pulmonary:     Effort: Pulmonary effort is normal.     Breath sounds: Normal breath sounds.  Abdominal:     General: Abdomen is flat.     Palpations: Abdomen is soft.  Musculoskeletal:        General: Normal range of motion.  Skin:    General: Skin is warm and dry.  Neurological:     General: No focal deficit present.     Mental Status: He is alert. Mental status is at baseline.  Psychiatric:        Attention and Perception: Attention normal.        Mood and Affect: Mood normal.        Speech: Speech normal.        Behavior: Behavior is cooperative.        Thought Content: Thought content normal.        Cognition and Memory: Cognition normal.    Review of Systems  Constitutional: Negative.   HENT: Negative.    Eyes: Negative.   Respiratory: Negative.    Cardiovascular: Negative.   Gastrointestinal: Negative.   Musculoskeletal: Negative.   Skin: Negative.   Neurological: Negative.   Psychiatric/Behavioral: Negative.     Blood pressure 113/76, pulse 85, temperature 97.8 F (36.6 C), temperature source Oral, resp. rate 18, height 4\' 11"  (1.499 m), weight 53.1 kg, SpO2 100 %. Body mass index is 23.63 kg/m.   Treatment Plan  Summary: Medication management and Plan patient requested that we try Lexapro.  Apparently the patient's mother has been on that and has faith in it which seems to be inspiring the patient.  Seems a reasonable choice to me.  I had also considered Abilify but if Effexor works that is probably a better option.  We will start at the low dose of 5 mg tomorrow.  Side effects reviewed.  In any case we are proposing likely discharge tomorrow.  Alethia Berthold, MD 01/30/2023, 2:36 PM

## 2023-01-31 DIAGNOSIS — F332 Major depressive disorder, recurrent severe without psychotic features: Secondary | ICD-10-CM | POA: Diagnosis not present

## 2023-01-31 MED ORDER — TRAZODONE HCL 50 MG PO TABS: 50.0000 mg | ORAL_TABLET | Freq: Every evening | ORAL | 1 refills | Status: DC | PRN

## 2023-01-31 MED ORDER — ESCITALOPRAM OXALATE 5 MG PO TABS: 5.0000 mg | ORAL_TABLET | Freq: Every day | ORAL | 1 refills | Status: DC

## 2023-01-31 MED ORDER — HYDROXYZINE HCL 25 MG PO TABS: 25.0000 mg | ORAL_TABLET | Freq: Four times a day (QID) | ORAL | 1 refills | Status: AC | PRN

## 2023-01-31 NOTE — Progress Notes (Signed)
Patient calm and pleasant during assessment denying SI/HI/AVH. Pt observed interacting appropriately with staff and peers on the unit. Pt compliant with medication administration per MD orders. Pt given education, support, and encouragement to be active in her treatment plan. Pt being monitored Q 15 minutes for safety per unit protocol, remains safe on the unit   

## 2023-01-31 NOTE — Discharge Summary (Signed)
Physician Discharge Summary Note  Patient:  Andrea Mccarthy is an 22 y.o., adult MRN:  [id number] DOB:  Sep 27, 2001 Patient phone:  [phone] (home)  Patient address:   [place] [address],  Total Time spent with patient: 30 minutes  Date of Admission:  01/27/2023 Date of Discharge: 01/31/2023  Reason for Admission: Patient was admitted because of symptoms of major depression with overdose of prescription medicine  Principal Problem: MDD (major depressive disorder), recurrent severe, without psychosis ([place]) Discharge Diagnoses: Principal Problem:   MDD (major depressive disorder), recurrent severe, without psychosis ([place])   Past Psychiatric History: Past history of treatment for depression and anxiety.  Outpatient treatment through [place].  Past Medical History:  Past Medical History:  Diagnosis Date   IBS (irritable bowel syndrome)    Migraine    History reviewed. No pertinent surgical history. Family History:  Family History  Family history unknown: Yes   Family Psychiatric  History: See previous Social History:  Social History   Substance and Sexual Activity  Alcohol Use No     Social History   Substance and Sexual Activity  Drug Use Never    Social History   Socioeconomic History   Marital status: Single    Spouse name: Not on file   Number of children: Not on file   Years of education: Not on file   Highest education level: Not on file  Occupational History   Not on file  Tobacco Use   Smoking status: Never   Smokeless tobacco: Never  Vaping Use   Vaping Use: Never used  Substance and Sexual Activity   Alcohol use: No   Drug use: Never   Sexual activity: Not on file  Other Topics Concern   Not on file  Social History Narrative   Not on file   Social Determinants of Health   Financial Resource Strain: Not on file  Food Insecurity: No Food Insecurity (01/27/2023)   Hunger Vital Sign    Worried About Running Out of Food in  the Last Year: Never true    Ran Out of Food in the Last Year: Never true  Transportation Needs: No Transportation Needs (01/27/2023)   PRAPARE - Hydrologist (Medical): No    Lack of Transportation (Non-Medical): No  Physical Activity: Not on file  Stress: Not on file  Social Connections: Not on file    Hospital Course: Admitted to psychiatric hospital.  Patient did not display any dangerous aggressive or violent behavior in the hospital.  He was compliant with medication.  We made a trial of mirtazapine at 30 mg.  Patient reported itching and feeling nauseous and overstimulated.  Also a brief trial of Wellbutrin which also caused side effects in this case over sedation.  Patient brought up the idea of Lexapro which his mother had recommended.  Started 5 mg of Lexapro a day which was tolerated.  At the time of discharge denied suicidal ideation and showed upbeat affect and was agreeable to outpatient follow-up  Physical Findings: AIMS: Facial and Oral Movements Muscles of Facial Expression: None, normal Lips and Perioral Area: None, normal Jaw: None, normal Tongue: None, normal,Extremity Movements Upper (arms, wrists, hands, fingers): None, normal Lower (legs, knees, ankles, toes): None, normal, Trunk Movements Neck, shoulders, hips: None, normal, Overall Severity Severity of abnormal movements (highest score from questions above): None, normal Incapacitation due to abnormal movements: None, normal Patient's awareness of abnormal movements (rate only patient's report): No  Awareness, Dental Status Current problems with teeth and/or dentures?: No Does patient usually wear dentures?: No  CIWA:    COWS:     Musculoskeletal: Strength & Muscle Tone: within normal limits Gait & Station: normal Patient leans: N/A   Psychiatric Specialty Exam:  Presentation  General Appearance:  Appropriate for Environment  Eye Contact: Fair  Speech: Clear and  Coherent  Speech Volume: Normal  Handedness: Right   Mood and Affect  Mood: Euthymic  Affect: Appropriate   Thought Process  Thought Processes: Coherent  Descriptions of Associations:Intact  Orientation:Full (Time, Place and Person)  Thought Content:Logical  History of Schizophrenia/Schizoaffective disorder:No  Duration of Psychotic Symptoms:No data recorded Hallucinations:No data recorded Ideas of Reference:None  Suicidal Thoughts:No data recorded Homicidal Thoughts:No data recorded  Sensorium  Memory: Immediate Good; Remote Fair  Judgment: Fair  Insight: Fair   Community education officer  Concentration: Fair  Attention Span: Fair  Recall: [place] of Knowledge: Good  Language: Good   Psychomotor Activity  Psychomotor Activity:No data recorded  Assets  Assets: Desire for Improvement; Communication Skills; Leisure Time; Social Support; Housing   Sleep  Sleep:No data recorded   Physical Exam: Physical Exam Vitals reviewed.  Constitutional:      Appearance: Normal appearance.  HENT:     Head: Normocephalic and atraumatic.     Mouth/Throat:     Pharynx: Oropharynx is clear.  Eyes:     Pupils: Pupils are equal, round, and reactive to light.  Cardiovascular:     Rate and Rhythm: Normal rate and regular rhythm.  Pulmonary:     Effort: Pulmonary effort is normal.     Breath sounds: Normal breath sounds.  Abdominal:     General: Abdomen is flat.     Palpations: Abdomen is soft.  Musculoskeletal:        General: Normal range of motion.  Skin:    General: Skin is warm and dry.  Neurological:     General: No focal deficit present.     Mental Status: He is alert. Mental status is at baseline.  Psychiatric:        Attention and Perception: Attention normal.        Mood and Affect: Mood normal.        Speech: Speech normal.        Behavior: Behavior normal.        Thought Content: Thought content normal.        Cognition and  Memory: Cognition normal.    Review of Systems  Constitutional: Negative.   HENT: Negative.    Eyes: Negative.   Respiratory: Negative.    Cardiovascular: Negative.   Gastrointestinal: Negative.   Musculoskeletal: Negative.   Skin: Negative.   Neurological: Negative.   Psychiatric/Behavioral: Negative.     Blood pressure 104/69, pulse 86, temperature 98.1 F (36.7 C), temperature source Oral, resp. rate 16, height 4\' 11"  (1.499 m), weight 53.1 kg, SpO2 100 %. Body mass index is 23.63 kg/m.   Social History   Tobacco Use  Smoking Status Never  Smokeless Tobacco Never   Tobacco Cessation:  N/A, patient does not currently use tobacco products   Blood Alcohol level:  Lab Results  Component Value Date   ETH <10 01/27/2023   ETH <10 [id number]    Metabolic Disorder Labs:  Lab Results  Component Value Date   HGBA1C 4.9 12/03/2022   MPG 93.93 12/03/2022   No results found for: "PROLACTIN" Lab Results  Component Value Date   CHOL  168 12/03/2022   TRIG 33 12/03/2022   HDL 66 12/03/2022   CHOLHDL 2.5 12/03/2022   VLDL 7 12/03/2022   [place] 95 12/03/2022    See Psychiatric Specialty Exam and Suicide Risk Assessment completed by Attending Physician prior to discharge.  Discharge destination:  Home  Is patient on multiple antipsychotic therapies at discharge:  No   Has Patient had three or more failed trials of antipsychotic monotherapy by history:  No  Recommended Plan for Multiple Antipsychotic Therapies: NA  Discharge Instructions     Diet - low sodium heart healthy   Complete by: As directed    Increase activity slowly   Complete by: As directed    Increase activity slowly   Complete by: As directed       Allergies as of 01/31/2023   No Known Allergies      Medication List     STOP taking these medications    mirtazapine 15 MG tablet Commonly known as: REMERON   QUEtiapine 100 MG tablet Commonly known as: SEROQUEL   sertraline 50 MG  tablet Commonly known as: ZOLOFT       TAKE these medications      Indication  escitalopram 5 MG tablet Commonly known as: LEXAPRO Take 1 tablet (5 mg total) by mouth daily. Start taking on: February 01, 2023  Indication: Major Depressive Disorder   hydrOXYzine 25 MG tablet Commonly known as: ATARAX Take 1 tablet (25 mg total) by mouth every 6 (six) hours as needed for anxiety. What changed: when to take this  Indication: Feeling Anxious   traZODone 50 MG tablet Commonly known as: DESYREL Take 1 tablet (50 mg total) by mouth at bedtime as needed for sleep.  Indication: Trouble Sleeping        Follow-up Information     BEHAVIORAL HEALTH PARTIAL HOSPITALIZATION PROGRAM Follow up.   Specialty: Behavioral Health Why: "You are scheduled for an assessment for the PHP on Wednesday, 3/27 at 2:00 pm. This appointment will last approximately one hour and will be virtual via Webex. Please download the [employer] app prior to the appointment. If you need to cancel or reschedule, please call [phone] and leave a voicemail with your name, date of birth, and phone number." Contact information: [place] [phone]                Follow-up recommendations:  Other:  Follow-up with outpatient mental health treatment as recommended.  Partial hospital possible option and will call to follow up with it.  Continue current medicine  Comments: See above  Signed: Alethia Berthold, MD 01/31/2023, 6:33 PM

## 2023-01-31 NOTE — Progress Notes (Signed)
  [facility] Adult Case Management Discharge Plan :  Will you be returning to the same living situation after discharge:  Yes,  pt reports that she is returning home. At discharge, do you have transportation home?: Yes,  pt reports that her car is the parking lot.  Do you have the ability to pay for your medications: Yes,  [facility] [place] [facility] [place]  Release of information consent forms completed and in the chart;  Patient's signature needed at discharge.  Patient to Follow up at:  Follow-up Information     BEHAVIORAL HEALTH PARTIAL HOSPITALIZATION PROGRAM Follow up.   Specialty: Behavioral Health Why: "You are scheduled for an assessment for the PHP on Wednesday, 3/27 at 2:00 pm. This appointment will last approximately one hour and will be virtual via Webex. Please download the [employer] app prior to the appointment. If you need to cancel or reschedule, please call [phone] and leave a voicemail with your name, date of birth, and phone number." Contact information: [place] [phone]                Next level of care provider has access to [place] and Suicide Prevention discussed: Yes,  SPE completed with patient and patients grandfather.     Has patient been referred to the Quitline?: N/A patient is not a smoker  Patient has been referred for addiction treatment: [place], LCSW 01/31/2023, 11:28 AM

## 2023-01-31 NOTE — Plan of Care (Signed)

## 2023-01-31 NOTE — BHH Suicide Risk Assessment (Signed)
[facility] Discharge Suicide Risk Assessment   Principal Problem: MDD (major depressive disorder), recurrent severe, without psychosis ([place]) Discharge Diagnoses: Principal Problem:   MDD (major depressive disorder), recurrent severe, without psychosis ([place])   Total Time spent with patient: 30 minutes  Musculoskeletal: Strength & Muscle Tone: within normal limits Gait & Station: normal Patient leans: N/A  Psychiatric Specialty Exam  Presentation  General Appearance:  Appropriate for Environment  Eye Contact: Fair  Speech: Clear and Coherent  Speech Volume: Normal  Handedness: Right   Mood and Affect  Mood: Euthymic  Duration of Depression Symptoms: Greater than two weeks  Affect: Appropriate   Thought Process  Thought Processes: Coherent  Descriptions of Associations:Intact  Orientation:Full (Time, Place and Person)  Thought Content:Logical  History of Schizophrenia/Schizoaffective disorder:No  Duration of Psychotic Symptoms:No data recorded Hallucinations:No data recorded Ideas of Reference:None  Suicidal Thoughts:No data recorded Homicidal Thoughts:No data recorded  Sensorium  Memory: Immediate Good; Remote Fair  Judgment: Fair  Insight: Fair   Community education officer  Concentration: Fair  Attention Span: Fair  Recall: [place] of Knowledge: Good  Language: Good   Psychomotor Activity  Psychomotor Activity:No data recorded  Assets  Assets: Desire for Improvement; Communication Skills; Leisure Time; Social Support; Housing   Sleep  Sleep:No data recorded  Physical Exam: Physical Exam Vitals and nursing note reviewed.  Constitutional:      Appearance: Normal appearance.  HENT:     Head: Normocephalic and atraumatic.     Mouth/Throat:     Pharynx: Oropharynx is clear.  Eyes:     Pupils: Pupils are equal, round, and reactive to light.  Cardiovascular:     Rate and Rhythm: Normal rate and regular rhythm.  Pulmonary:      Effort: Pulmonary effort is normal.     Breath sounds: Normal breath sounds.  Abdominal:     General: Abdomen is flat.     Palpations: Abdomen is soft.  Musculoskeletal:        General: Normal range of motion.  Skin:    General: Skin is warm and dry.  Neurological:     General: No focal deficit present.     Mental Status: He is alert. Mental status is at baseline.  Psychiatric:        Attention and Perception: Attention normal.        Mood and Affect: Mood normal.        Speech: Speech normal.        Behavior: Behavior normal.        Thought Content: Thought content normal.        Cognition and Memory: Cognition normal.    Review of Systems  Constitutional: Negative.   HENT: Negative.    Eyes: Negative.   Respiratory: Negative.    Cardiovascular: Negative.   Gastrointestinal: Negative.   Musculoskeletal: Negative.   Skin: Negative.   Neurological: Negative.   Psychiatric/Behavioral: Negative.     Blood pressure 104/69, pulse 86, temperature 98.1 F (36.7 C), temperature source Oral, resp. rate 16, height 4\' 11"  (1.499 m), weight 53.1 kg, SpO2 100 %. Body mass index is 23.63 kg/m.  Mental Status Per Nursing Assessment::   On Admission:  Self-harm thoughts  Demographic Factors:  Adolescent or young adult and transgender  Loss Factors: NA  Historical Factors: Impulsivity  Risk Reduction Factors:   Sense of responsibility to family, Employed, Positive social support, and Positive therapeutic relationship  Continued Clinical Symptoms:  Depression:   Impulsivity  Cognitive Features That Contribute  To Risk:  None    Suicide Risk:  Minimal: No identifiable suicidal ideation.  Patients presenting with no risk factors but with morbid ruminations; may be classified as minimal risk based on the severity of the depressive symptoms   Follow-up Information     [place] Follow up.   Specialty: Behavioral Health Why: "You  are scheduled for an assessment for the PHP on Wednesday, 3/27 at 2:00 pm. This appointment will last approximately one hour and will be virtual via Webex. Please download the [employer] app prior to the appointment. If you need to cancel or reschedule, please call [phone] and leave a voicemail with your name, date of birth, and phone number." Contact information: [place] recommendations:  Other:  Patient denies any suicidal ideation.  Affect and mood are both much improved.  No sign of psychosis.  Patient is optimistic and agreeable to outpatient treatment.  Alethia Berthold, MD 01/31/2023, 11:36 AM

## 2023-01-31 NOTE — Group Note (Signed)
Recreation Therapy Group Note   Group Topic:Self-Esteem  Group Date: 01/31/2023 Start Time: 1000 End Time: 1105 Facilitators: Vilma Prader, LRT, CTRS Location:  C[facility]  Group Description: Patients and LRT discussed the importance of self-love and self-esteem. Pt completed a worksheet that helps them identify 24 different strengths and qualities about themselves. Pt encouraged to read aloud at least 3 off their sheet to the group. LRT and pts discussed how this can be applied to daily life post-discharge.  Pt's then played "Positive Affirmation Bingo" afterwards, with journals or stress balls as bingo prizes.   Affect/Mood: Appropriate, Congruent, and Happy   Participation Level: Active and Engaged   Participation Quality: Independent   Behavior: Appropriate, Attentive , Calm, Cooperative, and Eager   Speech/Thought Process: Coherent   Insight: Good and Improved   Judgement: Good   Modes of Intervention: Guided Discussion and Worksheet   Patient Response to Interventions:  Attentive, Engaged, Interested , and Receptive   Education Outcome:  Acknowledges education   Clinical Observations/Individualized Feedback: "Andrea Mccarthy"  was active in their participation of session activities and group discussion. Pt identified "I am good at playing video games, art, and puzzles. What I value the most is physical affection. What compliment I receive are that I am a hard worker and I am nice." Pt was noticeably more interactive with peers and LRT throughout session than in previous groups. Pt had a bright affect and was pleasant duration of session.    Plan: Continue to engage patient in RT group sessions 2-3x/week.   Vilma Prader, LRT, CTRS 01/31/2023 11:28 AM

## 2023-01-31 NOTE — Progress Notes (Signed)
Patient ID: Andrea Mccarthy, adult   DOB: [birth year], 22 y.o.   MRN: [id number] Patient denies SI/HI/AVH. Belongings were returned to patient. Discharge instructions  including medication and follow up information were reviewed with patient and understanding was verbalized. Patient was not observed to be in distress at time of discharge. Patient was escorted out with staff to emergency department parking lot to their car to be transport themselves home.

## 2023-02-04 ENCOUNTER — Telehealth (HOSPITAL_COMMUNITY): Payer: Self-pay | Admitting: Licensed Clinical Social Worker

## 2023-02-05 ENCOUNTER — Ambulatory Visit (HOSPITAL_COMMUNITY): Payer: Medicaid Other | Admitting: Licensed Clinical Social Worker

## 2023-02-05 DIAGNOSIS — F411 Generalized anxiety disorder: Secondary | ICD-10-CM

## 2023-02-05 DIAGNOSIS — F332 Major depressive disorder, recurrent severe without psychotic features: Secondary | ICD-10-CM
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
## 2023-02-11 ENCOUNTER — Other Ambulatory Visit (HOSPITAL_COMMUNITY): Payer: Medicaid Other

## 2023-02-11 ENCOUNTER — Telehealth (HOSPITAL_COMMUNITY): Payer: Self-pay | Admitting: Licensed Clinical Social Worker

## 2023-02-11 ENCOUNTER — Ambulatory Visit (HOSPITAL_COMMUNITY): Payer: Medicaid Other

## 2023-02-12 ENCOUNTER — Other Ambulatory Visit (HOSPITAL_COMMUNITY): Payer: Medicaid Other

## 2023-02-12 ENCOUNTER — Ambulatory Visit (HOSPITAL_COMMUNITY): Payer: Medicaid Other

## 2023-02-13 ENCOUNTER — Other Ambulatory Visit (HOSPITAL_COMMUNITY): Payer: Medicaid Other

## 2023-02-13 ENCOUNTER — Ambulatory Visit (HOSPITAL_COMMUNITY): Payer: Medicaid Other

## 2023-02-13 ENCOUNTER — Telehealth (HOSPITAL_COMMUNITY): Payer: Self-pay | Admitting: Licensed Clinical Social Worker

## 2023-02-14 ENCOUNTER — Ambulatory Visit (HOSPITAL_COMMUNITY): Payer: Medicaid Other

## 2023-02-14 ENCOUNTER — Other Ambulatory Visit (HOSPITAL_COMMUNITY): Payer: Medicaid Other

## 2023-02-17 ENCOUNTER — Ambulatory Visit (HOSPITAL_COMMUNITY): Payer: Medicaid Other

## 2023-02-17 ENCOUNTER — Other Ambulatory Visit (HOSPITAL_COMMUNITY): Payer: Medicaid Other

## 2023-02-18 ENCOUNTER — Other Ambulatory Visit (HOSPITAL_COMMUNITY): Payer: Medicaid Other

## 2023-02-18 ENCOUNTER — Ambulatory Visit (HOSPITAL_COMMUNITY): Payer: Medicaid Other

## 2023-02-19 ENCOUNTER — Ambulatory Visit (HOSPITAL_COMMUNITY): Payer: Medicaid Other

## 2023-02-19 ENCOUNTER — Other Ambulatory Visit (HOSPITAL_COMMUNITY): Payer: Medicaid Other

## 2023-02-20 ENCOUNTER — Other Ambulatory Visit (HOSPITAL_COMMUNITY): Payer: Medicaid Other

## 2023-02-20 ENCOUNTER — Ambulatory Visit (HOSPITAL_COMMUNITY): Payer: Medicaid Other

## 2023-02-21 ENCOUNTER — Ambulatory Visit (HOSPITAL_COMMUNITY): Payer: Medicaid Other

## 2023-02-21 ENCOUNTER — Other Ambulatory Visit (HOSPITAL_COMMUNITY): Payer: Medicaid Other

## 2023-02-24 ENCOUNTER — Ambulatory Visit (HOSPITAL_COMMUNITY): Payer: Medicaid Other

## 2023-02-25 ENCOUNTER — Ambulatory Visit (HOSPITAL_COMMUNITY): Payer: Medicaid Other

## 2023-02-26 ENCOUNTER — Other Ambulatory Visit (HOSPITAL_COMMUNITY): Payer: Medicaid Other

## 2023-02-26 ENCOUNTER — Ambulatory Visit (HOSPITAL_COMMUNITY): Payer: Medicaid Other

## 2023-02-27 ENCOUNTER — Ambulatory Visit (INDEPENDENT_AMBULATORY_CARE_PROVIDER_SITE_OTHER): Payer: BC Managed Care – PPO | Admitting: Licensed Clinical Social Worker

## 2023-02-27 ENCOUNTER — Ambulatory Visit (HOSPITAL_COMMUNITY): Payer: Medicaid Other

## 2023-02-27 ENCOUNTER — Other Ambulatory Visit (HOSPITAL_COMMUNITY): Payer: Medicaid Other

## 2023-02-27 DIAGNOSIS — F411 Generalized anxiety disorder: Secondary | ICD-10-CM

## 2023-02-27 DIAGNOSIS — F332 Major depressive disorder, recurrent severe without psychotic features: Secondary | ICD-10-CM

## 2023-02-27 MED ORDER — ESCITALOPRAM OXALATE 10 MG PO TABS: 10.0000 mg | ORAL_TABLET | Freq: Every day | ORAL | 0 refills | Status: DC
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
## 2023-02-27 NOTE — Progress Notes (Signed)
   02/27/23 0905  Patient-Centered Profile  PCP Completed On 02/27/23  Plan Meeting Date 02/27/23  Effective Date 02/27/23  What People Like and [place] About? "The fact that I'm willing to work hard and pick up shifts for them. I'm reliable."  What's Important to? "Mostly my friends and family."  How Best to Support? "Probably working with med management the most and working with good coping steps and get them to the best of my ability."  Add What's Working / [employer] Not Working? "So far finding a good medication plan. It'll work for the first few weeks and then it'll tank. Just rash, impulsive decisions, which I need to get better at managing, probably."  PCP Action Plan  Long Range Outcome Pt will report increased ability to manage depression and anxiety symptoms using coping skills at least 3x a day. Pt will report decrease in passive SI to at least no more than 1x a week. Pt will report increased ability to catch, challenge, change cognitive distortions at least 3x a day.   Pt agrees to plan

## 2023-02-28 ENCOUNTER — Ambulatory Visit (INDEPENDENT_AMBULATORY_CARE_PROVIDER_SITE_OTHER): Payer: BC Managed Care – PPO | Admitting: Licensed Clinical Social Worker

## 2023-02-28 ENCOUNTER — Encounter (HOSPITAL_COMMUNITY): Payer: Self-pay

## 2023-02-28 ENCOUNTER — Other Ambulatory Visit (HOSPITAL_COMMUNITY): Payer: Medicaid Other

## 2023-02-28 ENCOUNTER — Ambulatory Visit (HOSPITAL_COMMUNITY): Payer: Medicaid Other

## 2023-02-28 DIAGNOSIS — F411 Generalized anxiety disorder: Secondary | ICD-10-CM

## 2023-02-28 DIAGNOSIS — F332 Major depressive disorder, recurrent severe without psychotic features: Secondary | ICD-10-CM

## 2023-02-28 NOTE — Progress Notes (Signed)
Spoke with patient via WebEx video call, used 2 identifiers to correctly identify patient. States that groups are going well. This is her first time in PHP. He was having issues with friends, balancing work and college and family issues. He is a transgender female and prefers he/him as pronouns. Has passive SI but no plan or intent. Denies HI or AV hallucinations. PHQ9=26. Feels tired all the time and has headaches since starting Lexapro. No other side effects. No issues or complaints.

## 2023-03-03 ENCOUNTER — Ambulatory Visit (INDEPENDENT_AMBULATORY_CARE_PROVIDER_SITE_OTHER): Payer: BC Managed Care – PPO | Admitting: Licensed Clinical Social Worker

## 2023-03-03 ENCOUNTER — Ambulatory Visit (HOSPITAL_COMMUNITY): Payer: Medicaid Other

## 2023-03-03 ENCOUNTER — Other Ambulatory Visit (HOSPITAL_COMMUNITY): Payer: Medicaid Other

## 2023-03-03 DIAGNOSIS — F332 Major depressive disorder, recurrent severe without psychotic features: Secondary | ICD-10-CM

## 2023-03-03 DIAGNOSIS — F411 Generalized anxiety disorder: Secondary | ICD-10-CM

## 2023-03-04 ENCOUNTER — Other Ambulatory Visit (HOSPITAL_COMMUNITY): Payer: Medicaid Other

## 2023-03-04 ENCOUNTER — Ambulatory Visit (HOSPITAL_COMMUNITY): Payer: Medicaid Other

## 2023-03-04 ENCOUNTER — Ambulatory Visit (INDEPENDENT_AMBULATORY_CARE_PROVIDER_SITE_OTHER): Payer: BC Managed Care – PPO | Admitting: Licensed Clinical Social Worker

## 2023-03-04 DIAGNOSIS — F411 Generalized anxiety disorder: Secondary | ICD-10-CM

## 2023-03-04 DIAGNOSIS — F332 Major depressive disorder, recurrent severe without psychotic features: Secondary | ICD-10-CM

## 2023-03-04 MED ORDER — TRAZODONE HCL 50 MG PO TABS: 25.0000 mg | ORAL_TABLET | Freq: Every evening | ORAL | 0 refills | Status: DC | PRN

## 2023-03-04 NOTE — Progress Notes (Signed)
Virtual Visit via Video Note  I connected with [name] on 03/04/23 at  9:00 AM EDT by a video enabled telemedicine application and verified that I am speaking with the correct person using two identifiers.  Location: Patient: Home Provider: Office   I discussed the limitations of evaluation and management by telemedicine and the availability of in person appointments. The patient expressed understanding and agreed to proceed.    I discussed the assessment and treatment plan with the patient. The patient was provided an opportunity to ask questions and all were answered. The patient agreed with the plan and demonstrated an understanding of the instructions.   The patient was advised to call back or seek an in-person evaluation if the symptoms worsen or if the condition fails to improve as anticipated.   Bobbye Morton, MD  [facility] MD/PA/NP OP Progress Note  03/04/2023 4:35 PM NERINE PULSE  MRN:  [id number]  Chief Complaint:  Chief Complaint  Patient presents with   Depression   Anxiety   HPI: Andrea Mccarthy is a 22 yo FTM patient with hx of MDD and GAD.    Pronouns he/ him   Current medications: Lexapro  daily Trazodone  QHS Hydroxyzine  QID  Patient reports that they have low energy, and are basically spending days in the house unless he has to go to work. Patient reports that he is sleeping well with the trazodone, but is worried about it affecting blood pressure. he reports without the trazodone he wakes up in the middle of the night. Patient reports that their anxiety is still pretty high, but does not identify a trigger. Patient reports that they are stressed by finances, like medical debt a and insurance, and basic daily bills. Patient reports that they are going to work for all their shifts. Patient reports that they feel like they are dragging their friends down with their existence. He endorses that they feel these thoughts more strongly at  night when trying to sleep.  Patient denies self-harm and SI. Patient denies HI, and AVH.   Patient reports that his appetite is poor. Patient endorses that they are currently menstruating.  Patient reports that they felt nauseous and bloated when they tried to eat. He endorses some weight loss. Patient reports that they are able to keep themselves hydrated.   Patient reports that the hydroxyzine QID and does not feel like it is helping.    Visit Diagnosis:    ICD-10-CM   1. MDD (major depressive disorder), recurrent severe, without psychosis  F33.2     2. GAD (generalized anxiety disorder)  F41.1       Past Psychiatric History: OPT: in the past INPT: Yes 2x in 01/2023 [facility] and 11/2022 at [facility] Therapy: not currently, had one in 2016 and 2021 Medications: Zoloft (hives and tongue burning), Remeron (tongue burning, hives )- required steroids for both and Prozac all failed  Past Medical History:  Past Medical History:  Diagnosis Date   Anxiety    Depression    IBS (irritable bowel syndrome)    Migraine    No past surgical history on file.  Family Psychiatric History: Mom: depression Deceased GMA and M Aunt: bipolar Think Aunt killed herself due to cancer treatments not being effective Multiple family members with substance abuse leading to patient avoiding substances.  Family History:  Family History  Problem Relation Age of Onset   Depression Mother    ADD / ADHD Father    ADD /  ADHD Sister    Bipolar disorder Maternal Aunt    Alcohol abuse Maternal Uncle    Bipolar disorder Maternal Grandmother     Social History:  Social History   Socioeconomic History   Marital status: Significant Other    Spouse name: Not on file   Number of children: 0   Years of education: Not on file   Highest education level: Some college, no degree  Occupational History   Not on file  Tobacco Use   Smoking status: Never   Smokeless tobacco: Never  Vaping Use   Vaping Use: Never used   Substance and Sexual Activity   Alcohol use: No   Drug use: Never   Sexual activity: Not on file  Other Topics Concern   Not on file  Social History Narrative   Not on file   Social Determinants of Health   Financial Resource Strain: Not on file  Food Insecurity: No Food Insecurity (01/27/2023)   Hunger Vital Sign    Worried About Running Out of Food in the Last Year: Never true    Ran Out of Food in the Last Year: Never true  Transportation Needs: No Transportation Needs (01/27/2023)   PRAPARE - Administrator, Civil Service (Medical): No    Lack of Transportation (Non-Medical): No  Physical Activity: Not on file  Stress: Not on file  Social Connections: Not on file    Allergies:  Allergies  Allergen Reactions   Remeron [Mirtazapine] Rash    Burning sensation in her mouth   Zoloft [Sertraline] Rash    Also had burning in her mouth    Metabolic Disorder Labs: Lab Results  Component Value Date   HGBA1C 4.9 12/03/2022   MPG 93.93 12/03/2022   No results found for: "PROLACTIN" Lab Results  Component Value Date   CHOL 168 12/03/2022   TRIG 33 12/03/2022   HDL 66 12/03/2022   CHOLHDL 2.5 12/03/2022   VLDL 7 12/03/2022   LDLCALC 95 12/03/2022   Lab Results  Component Value Date   TSH 1.283 12/03/2022    Therapeutic Level Labs: No results found for: "LITHIUM" No results found for: "VALPROATE" No results found for: "CBMZ"  Current Medications: Current Outpatient Medications  Medication Sig Dispense Refill   escitalopram (LEXAPRO) 10 MG tablet Take 1 tablet (10 mg total) by mouth daily. 10 tablet 0   hydrOXYzine (ATARAX) 25 MG tablet Take 1 tablet (25 mg total) by mouth every 6 (six) hours as needed for anxiety. 60 tablet 1   traZODone (DESYREL) 50 MG tablet Take 1 tablet (50 mg total) by mouth at bedtime as needed for sleep. 30 tablet 1   No current facility-administered medications for this visit.      Psychiatric Specialty Exam: Review of  Systems  Psychiatric/Behavioral:  Positive for dysphoric mood. Negative for hallucinations and suicidal ideas. The patient is nervous/anxious.     There were no vitals taken for this visit.There is no height or weight on file to calculate BMI.  General Appearance: Casual  Eye Contact:  Good  Speech:  Clear and Coherent  Volume:  Normal  Mood:  Dysphoric  Affect:  Restricted  Thought Process:  Goal Directed  Orientation:  Full (Time, Place, and Person)  Thought Content: Rumination   Suicidal Thoughts:  No  Homicidal Thoughts:  No  Memory:  Immediate;   Good Recent;   Good  Judgement:  Fair  Insight:  Lacking  Psychomotor Activity:  Psychomotor Retardation  Concentration:  Concentration: Fair  Recall:  NA  Fund of Knowledge: Fair  Language: Fair  Akathisia:  NA  Handed:    AIMS (if indicated): not done  Assets:  Media planner  ADL's:  Intact  Cognition: WNL  Sleep:  Fair   Screenings: AIMS    Flowsheet Row Admission (Discharged) from 01/27/2023 in [facility]  AIMS Total Score 0 0      AUDIT    Flowsheet Row Admission (Discharged) from 01/27/2023 in [facility] INPATIENT BE[facility]AVIORAL MEDICINE [facility] ([facility]  Alcohol Use Disorder Identification Test Final Score (AUDIT) 0 1      PHQ2-9    Flowsheet Row Counselor from 02/28/2023 in [facility]  PHQ-2 Total Score 6  PHQ-9 Total Score 26      Flowsheet Row Counselor from 02/28/2023 in [facility] Counselor from 02/05/2023 in [facility] Admission (Discharged) from [facility]/27/2023 in [facility]PAT[facility] BE[facility]AL MEDICINE  C-SSRS RISK CATEGORY Error: Question 6 not populated Error: Q3, 4, or 5 should not be populated when Q2 is No High  Risk        Assessment and Plan:  Overall patient is still adjusting to recent medication change of increasing her Lexapro to 10 mg.  Did recommend that she can discontinue the hydroxyzine as she is not finding it beneficial.  Patient endorses feeling constantly anxious however objectively patient does not appear to be anxious and also reports that they are getting to work and not having any issues in completing his daily task.  Patient appeared to ruminate or be hyperfocused on negatives in his life such as feeling bloated and nauseous when trying to eat.  However patient failed to mention that they were currently menstruating until provider asked after going through multiple other reasons that it patient is suddenly having appetite change.  patient does continue to endorse dysphoric mood and thought process however his appetite change was a bit abrupt and reported symptoms are more likely related to current menstruation and mood.  However current menstruation may be negatively impacting patient's mood.  Patient will continue to benefit more from therapy at this time to help reframe thought process about how she interacts with others and her idea of being a "burden."  MDD GAD Social anxiety d/o Gender dysphoria PTSD -Continue Lexparo to  daily -Decrease trazodone to 25 mg nightly as needed   Collaboration of Care: Collaboration of Care: PHP  Patient/Guardian was advised Release of Information must be obtained prior to any record release in order to collaborate their care with an outside provider. Patient/Guardian was advised if they have not already done so to contact the registration department to sign all necessary forms in order for [place] to release information regarding their care.   Consent: Patient/Guardian gives verbal consent for treatment and assignment of benefits for services provided during this visit. Patient/Guardian expressed understanding and agreed to proceed.   PGY-3 Bobbye Morton, MD 03/04/2023, 4:35 PM

## 2023-03-05 ENCOUNTER — Ambulatory Visit (HOSPITAL_COMMUNITY): Payer: Medicaid Other

## 2023-03-05 ENCOUNTER — Other Ambulatory Visit (HOSPITAL_COMMUNITY): Payer: Medicaid Other

## 2023-03-06 ENCOUNTER — Ambulatory Visit (INDEPENDENT_AMBULATORY_CARE_PROVIDER_SITE_OTHER): Payer: BC Managed Care – PPO | Admitting: Licensed Clinical Social Worker

## 2023-03-06 ENCOUNTER — Other Ambulatory Visit (HOSPITAL_COMMUNITY): Payer: Medicaid Other

## 2023-03-06 DIAGNOSIS — F332 Major depressive disorder, recurrent severe without psychotic features: Secondary | ICD-10-CM

## 2023-03-06 DIAGNOSIS — F411 Generalized anxiety disorder: Secondary | ICD-10-CM

## 2023-03-07 ENCOUNTER — Encounter (HOSPITAL_COMMUNITY): Payer: Self-pay

## 2023-03-07 ENCOUNTER — Other Ambulatory Visit (HOSPITAL_COMMUNITY): Payer: Self-pay | Admitting: Student in an Organized Health Care Education/Training Program

## 2023-03-07 ENCOUNTER — Ambulatory Visit (INDEPENDENT_AMBULATORY_CARE_PROVIDER_SITE_OTHER): Payer: BC Managed Care – PPO | Admitting: Licensed Clinical Social Worker

## 2023-03-07 DIAGNOSIS — F332 Major depressive disorder, recurrent severe without psychotic features: Secondary | ICD-10-CM

## 2023-03-07 DIAGNOSIS — F411 Generalized anxiety disorder: Secondary | ICD-10-CM

## 2023-03-07 MED ORDER — TRAZODONE HCL 50 MG PO TABS: 25.0000 mg | ORAL_TABLET | Freq: Every evening | ORAL | 0 refills | Status: AC | PRN

## 2023-03-07 NOTE — Progress Notes (Signed)
Spoke with patient via WebEx video call, used 2 identifiers to correctly identify patient. States groups are going OK. Feels more depressed today. On scale 1-10 as 10 being worst he rates depression at 9 and anxiety at 9.5. Denies SI/HI or AV hallucinations. No side effects from medication. No issue or complaints.

## 2023-03-10 ENCOUNTER — Encounter (HOSPITAL_COMMUNITY): Payer: Self-pay

## 2023-03-10 ENCOUNTER — Ambulatory Visit (INDEPENDENT_AMBULATORY_CARE_PROVIDER_SITE_OTHER): Payer: BC Managed Care – PPO | Admitting: Licensed Clinical Social Worker

## 2023-03-10 DIAGNOSIS — F332 Major depressive disorder, recurrent severe without psychotic features: Secondary | ICD-10-CM

## 2023-03-11 ENCOUNTER — Ambulatory Visit (INDEPENDENT_AMBULATORY_CARE_PROVIDER_SITE_OTHER): Payer: BC Managed Care – PPO | Admitting: Licensed Clinical Social Worker

## 2023-03-11 DIAGNOSIS — F411 Generalized anxiety disorder: Secondary | ICD-10-CM

## 2023-03-11 DIAGNOSIS — F332 Major depressive disorder, recurrent severe without psychotic features: Secondary | ICD-10-CM

## 2023-03-12 ENCOUNTER — Other Ambulatory Visit (HOSPITAL_COMMUNITY): Payer: Self-pay | Admitting: Student in an Organized Health Care Education/Training Program

## 2023-03-12 ENCOUNTER — Telehealth (HOSPITAL_COMMUNITY): Payer: Self-pay | Admitting: *Deleted

## 2023-03-12 ENCOUNTER — Ambulatory Visit (INDEPENDENT_AMBULATORY_CARE_PROVIDER_SITE_OTHER): Payer: BC Managed Care – PPO | Admitting: Licensed Clinical Social Worker

## 2023-03-12 DIAGNOSIS — F332 Major depressive disorder, recurrent severe without psychotic features: Secondary | ICD-10-CM

## 2023-03-12 DIAGNOSIS — F411 Generalized anxiety disorder: Secondary | ICD-10-CM

## 2023-03-12 MED ORDER — ESCITALOPRAM OXALATE 10 MG PO TABS: 10.0000 mg | ORAL_TABLET | Freq: Every day | ORAL | 0 refills | Status: AC

## 2023-03-12 NOTE — Psych (Signed)
Virtual Visit via Video Note  I connected with Merridith Dershem Charlet on 02/27/23 at  9:00 AM EDT by a video enabled telemedicine application and verified that I am speaking with the correct person using two identifiers.  Location: Patient: patient home Provider: clinical home office   I discussed the limitations of evaluation and management by telemedicine and the availability of in person appointments. The patient expressed understanding and agreed to proceed.  I discussed the assessment and treatment plan with the patient. The patient was provided an opportunity to ask questions and all were answered. The patient agreed with the plan and demonstrated an understanding of the instructions.   The patient was advised to call back or seek an in-person evaluation if the symptoms worsen or if the condition fails to improve as anticipated.  Pt was provided 240 minutes of non-face-to-face time during this encounter.   Donia Guiles, LCSW   [facility] BH PHP THERAPIST PROGRESS NOTE  GAILA ENGEBRETSEN [id number]  Session Time: 9:00 - 10:00  Participation Level: Active  Behavioral Response: CasualAlertDepressed  Type of Therapy: Group Therapy  Treatment Goals addressed: Coping  Progress Towards Goals: Initial  Interventions: CBT, DBT, Supportive, and Reframing  Summary: Andrea Mccarthy is a 22 y.o. adult who presents with depression and anxiety symptoms.  Clinician led check-in regarding current stressors and situation, and review of patient completed daily inventory. Clinician utilized active listening and empathetic response and validated patient emotions. Clinician facilitated processing group on pertinent issues.?   Summary: Patient arrived within time allowed. Patient rates his mood at a 2 on a scale of 1-10 with 10 being best. Pt reports feeling "not great." Pt states he slept in 2 hour bursts and ate 1x. Pt reports he has had stomach issues since Tuesday and is not keeping much  down. Pt states he does not know if his medications are helping his symptoms and will talk to the psychiatrist about it. Pt identifies passive SI and denies plan and intent. Pt is short and not forthcoming. Pt able to process.?Pt engaged in discussion.?       Session Time: 10:00 am - 11:00 am  Participation Level: Active  Behavioral Response: CasualAlertAnxious and Depressed  Type of Therapy: Group Therapy  Treatment Goals addressed: Coping  Progress Towards Goals: Progressing  Interventions: CBT, DBT, Solution Focused, Strength-based, Supportive, and Reframing  Therapist Response: Cln led discussion on guilt and the way it impacts [place]. Cln utilized CBT cognitive distortion: emotional reasoning to inform discussion. Cln encouraged pt's to consider whether the guilt was founded as a first step to address the feeling. Group members discussed feelings of guilt and worked to determine whether those feelings were founded in truth or feeling.    Therapist Response: Pt engaged in discussion and is able to offer a guilt example and work through it with the group.          Session Time: 11:00 -12:00   Participation Level: Active   Behavioral Response: CasualAlertDepressed   Type of Therapy: Group Therapy   Treatment Goals addressed: Coping   Progress Towards Goals: Progressing   Interventions: CBT, DBT, Solution Focused, Strength-based, Supportive, and Reframing   Summary: Cln continued topic of boundaries. Cln discussed the different ways boundaries present: physical, emotional, intellectual, sexual, material, and time. Group talked about the ways in which each type presents for them and is a struggle.    Therapist Response: Pt engaged in discussion and identifies ways each type creates struggle.  Session Time: 12:00 pm - 1:00 pm  Participation Level: Active  Behavioral Response: CasualAlertAnxious and Depressed  Type of Therapy: Group Therapy  Treatment Goals  addressed: Coping  Progress Towards Goals: Progressing  Interventions: CBT, DBT, Solution Focused, Strength-based, Supportive, and Reframing  Therapist Response: 12:00 - 12:50 pm: OT group led by cln E. Hollan 12:50 - 1:00 pm: Clinician led check-out. Clinician assessed for immediate needs, medication compliance and efficacy, and safety concerns?  Summary: 12:00 - 12:50 pm: Pt participated 12:50 - 1:00 pm: At check-out, patient reports no immediate concerns.??Patient demonstrates progress as evidenced by participation in first group session. Patient denies SI/HI/self-harm thoughts at the end of group.    Suicidal/Homicidal: Yeswithout intent/plan  Plan: Pt will continue in PHP while working to decrease depression and anxiety symptoms, increase ability to manage impulsive behaviors, and increase ability to manage symptoms in healthy manner.   Collaboration of Care: Medication Management [employer]. McQuilla  Patient/Guardian was advised Release of Information must be obtained prior to any record release in order to collaborate their care with an outside provider. Patient/Guardian was advised if they have not already done so to contact the registration department to sign all necessary forms in order for [place] to release information regarding their care.   Consent: Patient/Guardian gives verbal consent for treatment and assignment of benefits for services provided during this visit. Patient/Guardian expressed understanding and agreed to proceed.   Diagnosis: GAD (generalized anxiety disorder) [F41.1]    1. GAD (generalized anxiety disorder)   2. MDD (major depressive disorder), recurrent severe, without psychosis (HCC)       Donia Guiles, LCSW

## 2023-03-12 NOTE — Telephone Encounter (Signed)
Done, patient is in IOP, they will schedule her f/u with her provider. She is dc'ing from IOP tom. - thanks

## 2023-03-12 NOTE — Telephone Encounter (Signed)
Refill request for Lexapro 10mg . No future appointment.

## 2023-03-12 NOTE — Psych (Signed)
Virtual Visit via Video Note  I connected with Andrea Mccarthy on 02/28/23 at  9:00 AM EDT by a video enabled telemedicine application and verified that I am speaking with the correct person using two identifiers.  Location: Patient: patient home Provider: clinical home office   I discussed the limitations of evaluation and management by telemedicine and the availability of in person appointments. The patient expressed understanding and agreed to proceed.  I discussed the assessment and treatment plan with the patient. The patient was provided an opportunity to ask questions and all were answered. The patient agreed with the plan and demonstrated an understanding of the instructions.   The patient was advised to call back or seek an in-person evaluation if the symptoms worsen or if the condition fails to improve as anticipated.  Pt was provided 240 minutes of non-face-to-face time during this encounter.   Donia Guiles, LCSW   [facility] BH PHP THERAPIST PROGRESS NOTE  Andrea Mccarthy [id number]  Session Time: 9:00 - 10:00  Participation Level: Active  Behavioral Response: CasualAlertDepressed  Type of Therapy: Group Therapy  Treatment Goals addressed: Coping  Progress Towards Goals: Initial  Interventions: CBT, DBT, Supportive, and Reframing  Summary: Andrea Mccarthy is a 22 y.o. adult who presents with depression and anxiety symptoms.  Clinician led check-in regarding current stressors and situation, and review of patient completed daily inventory. Clinician utilized active listening and empathetic response and validated patient emotions. Clinician facilitated processing group on pertinent issues.?   Summary: Patient arrived within time allowed. Patient rates his mood at a 5 on a scale of 1-10 with 10 being best. Pt reports feeling "not great." Pt states he slept 8 hours and ate 1x. Pt reports his stomach continued to bother him and reports this is typical for him  and declined to vivist his PCP. Pt reports running errands, doing laundry, and taking a nap. Pt able to process.?Pt engaged in discussion.?       Session Time: 10:00 am - 11:00 am  Participation Level: Active  Behavioral Response: CasualAlertAnxious and Depressed  Type of Therapy: Group Therapy  Treatment Goals addressed: Coping  Progress Towards Goals: Progressing  Interventions: CBT, DBT, Solution Focused, Strength-based, Supportive, and Reframing  Therapist Response: Cln led discussion on unknowns and the way they impact our anxiety. Cln discussed cognitive restructuring, DBT distraction skills, and positive mantras/prayer as ways to address the anxiety. Cln discussed idea "I can do hard things" and ways to bolster confidence in our ability to handle difficult situations.    Therapist Response: Pt shared current unknowns they are dealing with and is able to identify ways to manage.          Session Time: 11:00 -12:00   Participation Level: Active   Behavioral Response: CasualAlertDepressed   Type of Therapy: Group Therapy   Treatment Goals addressed: Coping   Progress Towards Goals: Progressing   Interventions: CBT, DBT, Solution Focused, Strength-based, Supportive, and Reframing   Summary: Cln continued topic of boundaries and led a "boundary workshop" in which group members brought current boundary issues and group worked together to apply boundary concepts to help address the concern. Cln helped shape conversation to maintain fidelity.    Therapist Response:  Pt engaged in discussion and shared a current boundary issue and reports gaining insight.      Session Time: 12:00 pm - 1:00 pm  Participation Level: Active  Behavioral Response: CasualAlertAnxious and Depressed  Type of Therapy: Group Therapy  Treatment Goals  addressed: Coping  Progress Towards Goals: Progressing  Interventions: CBT, DBT, Solution Focused, Strength-based, Supportive, and  Reframing  Therapist Response: 12:00 - 12:50 pm: OT group led by cln E. Hollan 12:50 - 1:00 pm: Clinician led check-out. Clinician assessed for immediate needs, medication compliance and efficacy, and safety concerns?  Summary: 12:00 - 12:50 pm: Pt participated 12:50 - 1:00 pm: At check-out, patient reports no immediate concerns.??Patient demonstrates progress as evidenced by participation in first group session. Patient denies SI/HI/self-harm thoughts at the end of group.    Suicidal/Homicidal: Yeswithout intent/plan  Plan: Pt will continue in PHP while working to decrease depression and anxiety symptoms, increase ability to manage impulsive behaviors, and increase ability to manage symptoms in healthy manner.   Collaboration of Care: Medication Management [employer]  Patient/Guardian was advised Release of Information must be obtained prior to any record release in order to collaborate their care with an outside provider. Patient/Guardian was advised if they have not already done so to contact the registration department to sign all necessary forms in order for [place] to release information regarding their care.   Consent: Patient/Guardian gives verbal consent for treatment and assignment of benefits for services provided during this visit. Patient/Guardian expressed understanding and agreed to proceed.   Diagnosis: MDD (major depressive disorder), recurrent severe, without psychosis (HCC) [F33.2]    1. MDD (major depressive disorder), recurrent severe, without psychosis (HCC)   2. GAD (generalized anxiety disorder)       Donia Guiles, LCSW

## 2023-03-12 NOTE — Psych (Signed)
Virtual Visit via Video Note  I connected with Andrea Mccarthy on 03/03/23 at  9:00 AM EDT by a video enabled telemedicine application and verified that I am speaking with the correct person using two identifiers.  Location: Patient: patient home Provider: clinical home office   I discussed the limitations of evaluation and management by telemedicine and the availability of in person appointments. The patient expressed understanding and agreed to proceed.  I discussed the assessment and treatment plan with the patient. The patient was provided an opportunity to ask questions and all were answered. The patient agreed with the plan and demonstrated an understanding of the instructions.   The patient was advised to call back or seek an in-person evaluation if the symptoms worsen or if the condition fails to improve as anticipated.  Pt was provided 240 minutes of non-face-to-face time during this encounter.   Donia Guiles, LCSW   [facility] BH PHP THERAPIST PROGRESS NOTE  Andrea Mccarthy [id number]  Session Time: 9:00 - 10:00  Participation Level: Active  Behavioral Response: CasualAlertDepressed  Type of Therapy: Group Therapy  Treatment Goals addressed: Coping  Progress Towards Goals: Initial  Interventions: CBT, DBT, Supportive, and Reframing  Summary: Andrea Mccarthy is a 22 y.o. adult who presents with depression and anxiety symptoms.  Clinician led check-in regarding current stressors and situation, and review of patient completed daily inventory. Clinician utilized active listening and empathetic response and validated patient emotions. Clinician facilitated processing group on pertinent issues.?   Summary: Patient arrived within time allowed. Patient rates his mood at a 3.5 on a scale of 1-10 with 10 being best. Pt reports feeling "tired." Pt states he slept "lots" and ate 1x. Pt reports he worked 3 days this weekend and is very tired however received positive  feedback and good tips which made him proud. Pt reports support from his partner. Pt continues to present as flat and with short answers. Pt able to process.?Pt engaged in discussion.?       Session Time: 10:00 am - 11:00 am  Participation Level: Active  Behavioral Response: CasualAlertAnxious and Depressed  Type of Therapy: Group Therapy  Treatment Goals addressed: Coping  Progress Towards Goals: Progressing  Interventions: CBT, DBT, Solution Focused, Strength-based, Supportive, and Reframing  Therapist Response: Cln led discussion on deep breathing and its therapeutic benefits, using DBT TIPP skills to inform discussion. Group practiced how to breathe from their diaphragms to ensure therapeutic quality and different ways to keep track of regulating breaths.    Therapist Response:  Pt engaged in discussion and practice.          Session Time: 11:00 -12:00   Participation Level: Active   Behavioral Response: CasualAlertDepressed   Type of Therapy: Group Therapy   Treatment Goals addressed: Coping   Progress Towards Goals: Progressing   Interventions: CBT, DBT, Solution Focused, Strength-based, Supportive, and Reframing   Summary: Cln led discussion on increasing support. Group members shared what supports they have and where it is lacking. Cln encouraged ways to increase adult friendships including accepting invitations, joining clubs/activities, reaching out, and support groups.    Therapist Response: Pt engaged in discussion and is able to determine ways to increase their support.     Session Time: 12:00 pm - 1:00 pm  Participation Level: Active  Behavioral Response: CasualAlertAnxious and Depressed  Type of Therapy: Group Therapy  Treatment Goals addressed: Coping  Progress Towards Goals: Progressing  Interventions: CBT, DBT, Solution Focused, Strength-based, Supportive, and Reframing  Therapist Response: 12:00 - 12:50 pm: OT group led by cln E.  Hollan 12:50 - 1:00 pm: Clinician led check-out. Clinician assessed for immediate needs, medication compliance and efficacy, and safety concerns?  Summary: 12:00 - 12:50 pm: Pt participated 12:50 - 1:00 pm: At check-out, patient reports no immediate concerns.??Patient demonstrates progress as evidenced by participation in first group session. Patient denies SI/HI/self-harm thoughts at the end of group.    Suicidal/Homicidal: Nowithout intent/plan  Plan: Pt will continue in PHP while working to decrease depression and anxiety symptoms, increase ability to manage impulsive behaviors, and increase ability to manage symptoms in healthy manner.   Collaboration of Care: Medication Management AEB J. McQuilla  Patient/Guardian was advised Release of Information must be obtained prior to any record release in order to collaborate their care with an outside provider. Patient/Guardian was advised if they have not already done so to contact the registration department to sign all necessary forms in order for [place] to release information regarding their care.   Consent: Patient/Guardian gives verbal consent for treatment and assignment of benefits for services provided during this visit. Patient/Guardian expressed understanding and agreed to proceed.   Diagnosis: MDD (major depressive disorder), recurrent severe, without psychosis (HCC) [F33.2]    1. MDD (major depressive disorder), recurrent severe, without psychosis (HCC)   2. GAD (generalized anxiety disorder)       Donia Guiles, LCSW

## 2023-03-13 ENCOUNTER — Ambulatory Visit (INDEPENDENT_AMBULATORY_CARE_PROVIDER_SITE_OTHER): Payer: BC Managed Care – PPO | Admitting: Licensed Clinical Social Worker

## 2023-03-13 DIAGNOSIS — F332 Major depressive disorder, recurrent severe without psychotic features: Secondary | ICD-10-CM | POA: Diagnosis not present

## 2023-03-13 DIAGNOSIS — F411 Generalized anxiety disorder: Secondary | ICD-10-CM | POA: Insufficient documentation

## 2023-03-13 NOTE — Psych (Signed)
Virtual Visit via Video Note  I connected with Andrea Mccarthy on 03/06/23 at  9:00 AM EDT by a video enabled telemedicine application and verified that I am speaking with the correct person using two identifiers.  Location: Patient: patient home Provider: clinical home office   I discussed the limitations of evaluation and management by telemedicine and the availability of in person appointments. The patient expressed understanding and agreed to proceed.  I discussed the assessment and treatment plan with the patient. The patient was provided an opportunity to Mccarthy questions and all were answered. The patient agreed with the plan and demonstrated an understanding of the instructions.   The patient was advised to call back or seek an in-person evaluation if the symptoms worsen or if the condition fails to improve as anticipated.  Pt was provided 240 minutes of non-face-to-face time during this encounter.   Donia Guiles, LCSW   [facility] BH PHP THERAPIST PROGRESS NOTE  Andrea Mccarthy [id number]  Session Time: 9:00 - 10:00  Participation Level: Active  Behavioral Response: CasualAlertDepressed  Type of Therapy: Group Therapy  Treatment Goals addressed: Coping  Progress Towards Goals: Progressing  Interventions: CBT, DBT, Supportive, and Reframing  Summary: Andrea Mccarthy is a 22 y.o. adult who presents with depression and anxiety symptoms.  Clinician led check-in regarding current stressors and situation, and review of patient completed daily inventory. Clinician utilized active listening and empathetic response and validated patient emotions. Clinician facilitated processing group on pertinent issues.?   Summary: Patient arrived within time allowed. Patient rates his mood at a 4 on a scale of 1-10 with 10 being best. Pt reports feeling "tired." Pt states he slept 4.5 hours and ate 1x. Pt reports he is feeling stressed and on edge. Pt states lacks of sleep and eating  is "catching up" to him. Pt declines to share stressors and what is on his mind. Pt able to process.?Pt engaged in discussion.?       Session Time: 10:00 am - 11:00 am  Participation Level: Active  Behavioral Response: CasualAlertAnxious and Depressed  Type of Therapy: Group Therapy  Treatment Goals addressed: Coping  Progress Towards Goals: Progressing  Interventions: CBT, DBT, Solution Focused, Strength-based, Supportive, and Reframing  Therapist Response: Cln led discussion on HALT (hungry, angry, lonely, tired) acronym, which encourages check-in with ourselves when feeling emotional. Cln encouraged pt's to use HALT as a first check-in step to address basic vulnerabilities before reacting. Group members discussed ways in which they react to being angry, lonely, and tired, and work to determine times in which utilizing HALT would have diverted a bigger issue.    Therapist Response:  Pt engaged in discussion and reports understanding of HALT and willingness to utilize it.          Session Time: 11:00 -12:00   Participation Level: Active   Behavioral Response: CasualAlertDepressed   Type of Therapy: Group Therapy   Treatment Goals addressed: Coping   Progress Towards Goals: Progressing   Interventions: CBT, DBT, Solution Focused, Strength-based, Supportive, and Reframing   Summary: Cln continued topic of DBT distress tolerance skills. Cln introduced TIPP skills to use during cases of extreme emotion. Group practiced deep breathing and progressive muscle relaxation together. Group discussed which situations they can apply TIPP skills.    Therapist Response:  Pt engaged in discussion and reports understanding of how to apply skills.      Session Time: 12:00 pm - 1:00 pm  Participation Level: Active  Behavioral Response:  CasualAlertAnxious and Depressed  Type of Therapy: Group Therapy  Treatment Goals addressed: Coping  Progress Towards Goals:  Progressing  Interventions: CBT, DBT, Solution Focused, Strength-based, Supportive, and Reframing  Therapist Response: 12:00 - 12:50 pm: OT group led by cln E. Hollan 12:50 - 1:00 pm: Clinician led check-out. Clinician assessed for immediate needs, medication compliance and efficacy, and safety concerns?  Summary: 12:00 - 12:50 pm: Pt participated 12:50 - 1:00 pm: At check-out, patient reports no immediate concerns.??Patient demonstrates progress as evidenced by continued engagement and responsiveness to treatment. Patient denies SI/HI/self-harm thoughts at the end of group.    Suicidal/Homicidal: Nowithout intent/plan  Plan: Pt will continue in PHP while working to decrease depression and anxiety symptoms, increase ability to manage impulsive behaviors, and increase ability to manage symptoms in healthy manner.   Collaboration of Care: Medication Management AEB J. McQuilla  Patient/Guardian was advised Release of Information must be obtained prior to any record release in order to collaborate their care with an outside provider. Patient/Guardian was advised if they have not already done so to contact the registration department to sign all necessary forms in order for [place] to release information regarding their care.   Consent: Patient/Guardian gives verbal consent for treatment and assignment of benefits for services provided during this visit. Patient/Guardian expressed understanding and agreed to proceed.   Diagnosis: MDD (major depressive disorder), recurrent severe, without psychosis (HCC) [F33.2]    1. MDD (major depressive disorder), recurrent severe, without psychosis (HCC)   2. GAD (generalized anxiety disorder)       Donia Guiles, LCSW

## 2023-03-13 NOTE — Psych (Signed)
Virtual Visit via Video Note  I connected with Andrea Mccarthy on 03/07/23 at  9:00 AM EDT by a video enabled telemedicine application and verified that I am speaking with the correct person using two identifiers.  Location: Patient: patient home Provider: clinical home office   I discussed the limitations of evaluation and management by telemedicine and the availability of in person appointments. The patient expressed understanding and agreed to proceed.  I discussed the assessment and treatment plan with the patient. The patient was provided an opportunity to ask questions and all were answered. The patient agreed with the plan and demonstrated an understanding of the instructions.   The patient was advised to call back or seek an in-person evaluation if the symptoms worsen or if the condition fails to improve as anticipated.  Pt was provided 240 minutes of non-face-to-face time during this encounter.   Donia Guiles, LCSW   [facility] BH PHP THERAPIST PROGRESS NOTE  JAIELLE DLOUHY [id number]  Session Time: 9:00 - 10:00  Participation Level: Active  Behavioral Response: CasualAlertDepressed  Type of Therapy: Group Therapy  Treatment Goals addressed: Coping  Progress Towards Goals: Progressing  Interventions: CBT, DBT, Supportive, and Reframing  Summary: Andrea Mccarthy is a 22 y.o. adult who presents with depression and anxiety symptoms.  Clinician led check-in regarding current stressors and situation, and review of patient completed daily inventory. Clinician utilized active listening and empathetic response and validated patient emotions. Clinician facilitated processing group on pertinent issues.?   Summary: Patient arrived within time allowed. Patient rates his mood at a 3 on a scale of 1-10 with 10 being best. Pt reports feeling "sick." Pt states he slept 6 hours and ate 1x. Pt reports he has flu symptoms, feeling achy, sick to his stomach, and has a headache.  Pt reports plans to call out of work because he does not feel physically up to it. Pt reports feeling "blah."Pt able to process.?Pt engaged in discussion.?       Session Time: 10:00 am - 11:00 am  Participation Level: Active  Behavioral Response: CasualAlertAnxious and Depressed  Type of Therapy: Group Therapy  Treatment Goals addressed: Coping  Progress Towards Goals: Progressing  Interventions: CBT, DBT, Solution Focused, Strength-based, Supportive, and Reframing  Therapist Response: Cln led discussion on how/when to share sensitive topics with others. Cln discussed the connection between trust and appropriate sharing and how to determine what is appropriate to share.    Therapist Response: Pt engaged in discussion and reports gaining insight.           Session Time: 11:00 -12:00   Participation Level: Active   Behavioral Response: CasualAlertDepressed   Type of Therapy: Group Therapy   Treatment Goals addressed: Coping   Progress Towards Goals: Progressing   Interventions: CBT, DBT, Solution Focused, Strength-based, Supportive, and Reframing   Summary: Cln led discussion on ways to manage stressors and feelings over the weekend. Group members  brainstormed things to do over the weekend for multiple levels of energy, access, and moods. Cln reviewed crisis services should they be needed and provided pt's with the text crisis line, mobile crisis, national suicide hotline, [facility] 24/7 line, and information on [facility].      Therapist Response:  Pt engaged in discussion and is able to identify 3 ideas of what to do over the weekend to keep their mind engaged.      Session Time: 12:00 pm - 1:00 pm  Participation Level: Active  Behavioral Response:  CasualAlertAnxious and Depressed  Type of Therapy: Group Therapy  Treatment Goals addressed: Coping  Progress Towards Goals: Progressing  Interventions: CBT, DBT, Solution Focused, Strength-based, Supportive, and  Reframing  Therapist Response: 12:00 - 12:50 pm: OT group led by cln E. Hollan 12:50 - 1:00 pm: Clinician led check-out. Clinician assessed for immediate needs, medication compliance and efficacy, and safety concerns?  Summary: 12:00 - 12:50 pm: Pt participated 12:50 - 1:00 pm: At check-out, patient reports no immediate concerns.??Patient demonstrates progress as evidenced by continued engagement and responsiveness to treatment. Patient denies SI/HI/self-harm thoughts at the end of group.    Suicidal/Homicidal: Nowithout intent/plan  Plan: Pt will continue in PHP while working to decrease depression and anxiety symptoms, increase ability to manage impulsive behaviors, and increase ability to manage symptoms in healthy manner.   Collaboration of Care: Medication Management AEB J. McQuilla  Patient/Guardian was advised Release of Information must be obtained prior to any record release in order to collaborate their care with an outside provider. Patient/Guardian was advised if they have not already done so to contact the registration department to sign all necessary forms in order for [place] to release information regarding their care.   Consent: Patient/Guardian gives verbal consent for treatment and assignment of benefits for services provided during this visit. Patient/Guardian expressed understanding and agreed to proceed.   Diagnosis: MDD (major depressive disorder), recurrent severe, without psychosis (HCC) [F33.2]    1. MDD (major depressive disorder), recurrent severe, without psychosis (HCC)   2. GAD (generalized anxiety disorder)       Donia Guiles, LCSW

## 2023-03-13 NOTE — Progress Notes (Signed)
Virtual Visit via Video Note  I connected with Andrea Mccarthy on 03/13/23 at  9:00 AM EDT by a video enabled telemedicine application and verified that I am speaking with the correct person using two identifiers.  Location: Patient: Home Provider: Office   I discussed the limitations of evaluation and management by telemedicine and the availability of in person appointments. The patient expressed understanding and agreed to proceed.    I discussed the assessment and treatment plan with the patient. The patient was provided an opportunity to ask questions and all were answered. The patient agreed with the plan and demonstrated an understanding of the instructions.   The patient was advised to call back or seek an in-person evaluation if the symptoms worsen or if the condition fails to improve as anticipated.  I provided 15 minutes of non-face-to-face time during this encounter.   Andrea Rack, NP   [facility]  Andrea Mccarthy [id number]  Admission date:02/27/2023 Discharge date: 03/14/2023  Reason for admission: Chart review patient was recently discharged from inpatient admission due to suicidal ideations and worsening depression.  Reports her stressors is related to worklife balance and grief and loss.  Chart reviewed medications to be continued at discharge Lexapro trazodone and hydroxyzine.  Progress in Program Toward Treatment Goals: Progressing  Progress (rationale): Andrea Mccarthy attended and participated with daily group session with active and engaged participation.  Denying suicidal or homicidal ideations.  Denies auditory visual hallucination at discharge.  Reports she has been compliant with medications however, states that she does not feel like the Lexapro is helping her symptoms.  States she was changed to Abilify which made her symptoms worse 1 month ago. Overall  she reported she is  "feeling pretty good".  Collaboration of Care: Medication Management AEB [facility] urgent care and Psychiatrist AEB please provide additional outpatient resources for walk-in services for throughput and or family services of the [place].  Patient/Guardian was advised Release of Information must be obtained prior to any record release in order to collaborate their care with an outside provider. Patient/Guardian was advised if they have not already done so to contact the registration department to sign all necessary forms in order for [place] to release information regarding their care.   Consent: Patient/Guardian gives verbal consent for treatment and assignment of benefits for services provided during this visit. Patient/Guardian expressed understanding and agreed to proceed.   Andrea Mccarthy, Nurse practitioner 03/13/2023

## 2023-03-13 NOTE — Psych (Signed)
Virtual Visit via Video Note  I connected with Andrea Mccarthy on 03/13/23 at  9:00 AM EDT by a video enabled telemedicine application and verified that I am speaking with the correct person using two identifiers.  Location: Patient: pt's home in [place], [place] Provider: clinical home office in [place], [place]   I discussed the limitations of evaluation and management by telemedicine and the availability of in person appointments. The patient expressed understanding and agreed to proceed.   I discussed the assessment and treatment plan with the patient. The patient was provided an opportunity to ask questions and all were answered. The patient agreed with the plan and demonstrated an understanding of the instructions.   The patient was advised to call back or seek an in-person evaluation if the symptoms worsen or if the condition fails to improve as anticipated.  I provided 240 minutes of non-face-to-face time during this encounter.   Wyvonnia Lora, LCSW   [facility] BH PHP THERAPIST PROGRESS NOTE  Andrea Mccarthy [id number]   Session Time: 9:00 am - 10:00 am  Participation Level: Active  Behavioral Response: CasualAlertDepressed and Flat  Type of Therapy: Group Therapy  Treatment Goals addressed: Coping  Progress Towards Goals: Progressing  Interventions: CBT, DBT, Solution Focused, Strength-based, Supportive, and Reframing  Therapist Response: Clinician led check-in regarding current stressors and situation, and review of patient completed daily inventory. Clinician utilized active listening and empathetic response and validated patient emotions. Clinician facilitated processing group on pertinent issues.?   Summary: Patient arrived within time allowed. Patient rates his (pt identifies as female and uses he/him pronouns) mood at a 2 or 3 on a scale of 1-10 with 10 being best. Pt reported, "Stress from money mainly." Reports he slept well last night and appetite was low  yesterday. Denies SI/SH thoughts. Denies topic for processing and declines to provide details contributing to low mood. Cln invited pt to discuss issues generally so as not to provide personal details while benefiting from group feedback and support and pt continued to decline. Pt able to process.?Pt engaged in discussion.?      Session Time: 10:00 am - 11:00 am  Participation Level: Minimal  Behavioral Response: CasualAlertDepressed and Flat  Type of Therapy: Group Therapy  Treatment Goals addressed: Coping  Progress Towards Goals: Progressing  Interventions: CBT, DBT, Solution Focused, Strength-based, Supportive, and Reframing  Therapist Response: Cln led group on self-compassion. Cln utilized CBT principles to inform discussion.  Summary: Pt engaged in discussion when prompted. He alludes to a situation which caused family members to worry and that he cannot forgive himself for. He is receptive to feedback.    Session Time: 11:00 am - 12:00 pm  Participation Level: None  Behavioral Response: CasualAlertDepressed and Flat  Type of Therapy: Group Therapy  Treatment Goals addressed: Coping  Progress Towards Goals: Progressing  Interventions: CBT, DBT, Solution Focused, Strength-based, Supportive, and Reframing  Therapist Response: Group viewed Ted Talk entitled "How To Not Take Things Personally" presented by Nicanor Alcon. Cln utilized CBT principles to inform discussion while pts were encouraged to share their response to the video. Cln asked each patient to share a time in which they took things personally and reframed the situation based on what was shared in the [place] Talk.  Summary: Pt was present for Ted Talk and discussion but did not participate.   Session Time: 12:00 pm - 1:00 pm  Participation Level: see OT note  Behavioral Response: see OT note  Type of Therapy: Group  Therapy  Treatment Goals addressed: Coping  Progress Towards Goals:  Progressing  Interventions: CBT, DBT, Solution Focused, Strength-based, Supportive, and Reframing  Therapist Response: 12:00 - 12:50 pm: See OT note. 12:50 - 1:00 pm: Clinician led check-out. Clinician assessed for immediate needs, medication compliance and efficacy, and safety concerns?  Summary: 12:00 - 12:50 pm: See OT note 12:50 - 1:00 pm: At check-out, patient contracts for safety.?Patient demonstrates progress as evidenced by his continued engagement and by being receptive to treatment. Patient denies SI/HI/self-harm thoughts at the end of group and agrees to seek help should those thoughts/feelings occur.?   Suicidal/Homicidal: Nowithout intent/plan  Plan: ?Pt will continue in PHP and medication management while continuing to work on decreasing depression symptoms,?SI, and anxiety symptoms,?and increasing the ability to self manage symptoms.  Collaboration of Care: Medication Management AEB Hillery Jacks, NP  Patient/Guardian was advised Release of Information must be obtained prior to any record release in order to collaborate their care with an outside provider. Patient/Guardian was advised if they have not already done so to contact the registration department to sign all necessary forms in order for [place] to release information regarding their care.   Consent: Patient/Guardian gives verbal consent for treatment and assignment of benefits for services provided during this visit. Patient/Guardian expressed understanding and agreed to proceed.   Diagnosis: MDD (major depressive disorder), recurrent severe, without psychosis (HCC) [F33.2]    1. MDD (major depressive disorder), recurrent severe, without psychosis (HCC)   2. GAD (generalized anxiety disorder)       Wyvonnia Lora, LCSW 03/13/2023

## 2023-03-13 NOTE — Psych (Signed)
Virtual Visit via Video Note  I connected with Andrea Mccarthy on 03/11/23 at  9:00 AM EDT by a video enabled telemedicine application and verified that I am speaking with the correct person using two identifiers.  Location: Patient: patient home Provider: clinical home office   I discussed the limitations of evaluation and management by telemedicine and the availability of in person appointments. The patient expressed understanding and agreed to proceed.  I discussed the assessment and treatment plan with the patient. The patient was provided an opportunity to ask questions and all were answered. The patient agreed with the plan and demonstrated an understanding of the instructions.   The patient was advised to call back or seek an in-person evaluation if the symptoms worsen or if the condition fails to improve as anticipated.  Pt was provided 240 minutes of non-face-to-face time during this encounter.   Donia Guiles, LCSW   [facility] BH PHP THERAPIST PROGRESS NOTE  Andrea Mccarthy [id number]  Session Time: 9:00 - 10:00  Participation Level: Active  Behavioral Response: CasualAlertDepressed  Type of Therapy: Group Therapy  Treatment Goals addressed: Coping  Progress Towards Goals: Progressing  Interventions: CBT, DBT, Supportive, and Reframing  Summary: Andrea Mccarthy is a 22 y.o. adult who presents with depression and anxiety symptoms.  Clinician led check-in regarding current stressors and situation, and review of patient completed daily inventory. Clinician utilized active listening and empathetic response and validated patient emotions. Clinician facilitated processing group on pertinent issues.?   Summary: Patient arrived within time allowed. Patient rates his mood at a 5 on a scale of 1-10 with 10 being best. Pt reports feeling "okay." Pt states he slept 7 hours and ate 1x. Pt reports he saw his mom's dog yesterday which improved his mood. Pt reports he spent  the day resting and spending time with the dog. Pt presents as less flat. Pt able to process.?Pt engaged in discussion.?       Session Time: 10:00 am - 11:00 am  Participation Level: Active  Behavioral Response: CasualAlertAnxious and Depressed  Type of Therapy: Group Therapy  Treatment Goals addressed: Coping  Progress Towards Goals: Progressing  Interventions: CBT, DBT, Solution Focused, Strength-based, Supportive, and Reframing  Therapist Response: Cln led discussion on DBT dialectics and balance. Cln encouraged pt's to utilize AND statements to cue their brain to hold two opposing ideas. Cln provided examples and group members created their own AND statements.   Therapist Response: Pt engaged in discussion and created AND statements with group.         Session Time: 11:00 -12:00   Participation Level: Active   Behavioral Response: CasualAlertDepressed   Type of Therapy: Group Therapy   Treatment Goals addressed: Coping   Progress Towards Goals: Progressing   Interventions: CBT, DBT, Solution Focused, Strength-based, Supportive, and Reframing   Summary: Cln introduced topic of CBT cognitive distortions. Cln discussed unhealthy thought patterns and how our thoughts shape our reality and irrational thoughts can alter our perspective. Cln utilized handout "cognitive distortions" to discuss common examples of distorted thoughts and group members worked to identify examples in their own life.    Therapist Response: Pt engaged in discussion and is able to determine examples of distorted thinking in their own life.        Session Time: 12:00 pm - 1:00 pm  Participation Level: Active  Behavioral Response: CasualAlertAnxious and Depressed  Type of Therapy: Group Therapy  Treatment Goals addressed: Coping  Progress Towards Goals: Progressing  Interventions: CBT, DBT, Solution Focused, Strength-based, Supportive, and Reframing  Therapist Response: 12:00 - 12:50  pm: OT group led by cln Andrea Mccarthy 12:50 - 1:00 pm: Clinician led check-out. Clinician assessed for immediate needs, medication compliance and efficacy, and safety concerns?  Summary: 12:00 - 12:50 pm: Pt participated 12:50 - 1:00 pm: At check-out, patient reports no immediate concerns.??Patient demonstrates progress as evidenced by continued engagement and responsiveness to treatment. Patient denies SI/HI/self-harm thoughts at the end of group.    Suicidal/Homicidal: Nowithout intent/plan  Plan: Pt will continue in PHP while working to decrease depression and anxiety symptoms, increase ability to manage impulsive behaviors, and increase ability to manage symptoms in healthy manner.   Collaboration of Care: Medication Management AEB Andrea Mccarthy  Patient/Guardian was advised Release of Information must be obtained prior to any record release in order to collaborate their care with an outside provider. Patient/Guardian was advised if they have not already done so to contact the registration department to sign all necessary forms in order for [place] to release information regarding their care.   Consent: Patient/Guardian gives verbal consent for treatment and assignment of benefits for services provided during this visit. Patient/Guardian expressed understanding and agreed to proceed.   Diagnosis: MDD (major depressive disorder), recurrent severe, without psychosis (HCC) [F33.2]    1. MDD (major depressive disorder), recurrent severe, without psychosis (HCC)   2. GAD (generalized anxiety disorder)       Donia Guiles, LCSW

## 2023-03-13 NOTE — Psych (Signed)
Virtual Visit via Video Note  I connected with Andrea Mccarthy on 03/12/23 at  9:00 AM EDT by a video enabled telemedicine application and verified that I am speaking with the correct person using two identifiers.  Location: Patient: patient home Provider: clinical home office   I discussed the limitations of evaluation and management by telemedicine and the availability of in person appointments. The patient expressed understanding and agreed to proceed.  I discussed the assessment and treatment plan with the patient. The patient was provided an opportunity to ask questions and all were answered. The patient agreed with the plan and demonstrated an understanding of the instructions.   The patient was advised to call back or seek an in-person evaluation if the symptoms worsen or if the condition fails to improve as anticipated.  Pt was provided 240 minutes of non-face-to-face time during this encounter.   Donia Guiles, LCSW   [facility] BH PHP THERAPIST PROGRESS NOTE  Andrea Mccarthy [id number]  Session Time: 9:00 - 10:00  Participation Level: Active  Behavioral Response: CasualAlertDepressed  Type of Therapy: Group Therapy  Treatment Goals addressed: Coping  Progress Towards Goals: Progressing  Interventions: CBT, DBT, Supportive, and Reframing  Summary: Andrea Mccarthy is a 22 y.o. adult who presents with depression and anxiety symptoms.  Clinician led check-in regarding current stressors and situation, and review of patient completed daily inventory. Clinician utilized active listening and empathetic response and validated patient emotions. Clinician facilitated processing group on pertinent issues.?   Summary: Patient arrived within time allowed. Patient rates his mood at a 6 on a scale of 1-10 with 10 being best. Pt reports feeling "okay." Pt states he slept 6 hours and ate 1x. Pt reports he spent the day resting. Pt reports continued self judgment for not being  "productive" enough.  Pt able to process.?Pt engaged in discussion.?       Session Time: 10:00 am - 11:00 am  Participation Level: Active  Behavioral Response: CasualAlertAnxious and Depressed  Type of Therapy: Group Therapy  Treatment Goals addressed: Coping  Progress Towards Goals: Progressing  Interventions: CBT, DBT, Solution Focused, Strength-based, Supportive, and Reframing  Therapist Response: Cln led processing group for pt's current struggles. Group members shared stressors and provided support and feedback. Cln brought in topics of boundaries, healthy relationships, and unhealthy thought processes to inform discussion.    Therapist Response: Pt able to process and provide support to group.          Session Time: 11:00 -12:00   Participation Level: Active   Behavioral Response: CasualAlertDepressed   Type of Therapy: Group Therapy, Spiritual Care   Treatment Goals addressed: Coping   Progress Towards Goals: Progressing   Interventions: Supportive, Education   Summary:  Laurell Josephs, Chaplain, led group.   Therapist Response: Pt participated       Session Time: 12:00 pm - 1:00 pm  Participation Level: Active  Behavioral Response: CasualAlertAnxious and Depressed  Type of Therapy: Group Therapy  Treatment Goals addressed: Coping  Progress Towards Goals: Progressing  Interventions: CBT, DBT, Solution Focused, Strength-based, Supportive, and Reframing  Therapist Response: 12:00 - 12:50 pm: OT group led by cln E. Hollan 12:50 - 1:00 pm: Clinician led check-out. Clinician assessed for immediate needs, medication compliance and efficacy, and safety concerns?  Summary: 12:00 - 12:50 pm: Pt participated 12:50 - 1:00 pm: At check-out, patient reports no immediate concerns.??Patient demonstrates progress as evidenced by continued engagement and responsiveness to treatment. Patient denies SI/HI/self-harm thoughts at the  end of  group.    Suicidal/Homicidal: Nowithout intent/plan  Plan: Pt will continue in PHP while working to decrease depression and anxiety symptoms, increase ability to manage impulsive behaviors, and increase ability to manage symptoms in healthy manner.   Collaboration of Care: Medication Management AEB J. McQuilla  Patient/Guardian was advised Release of Information must be obtained prior to any record release in order to collaborate their care with an outside provider. Patient/Guardian was advised if they have not already done so to contact the registration department to sign all necessary forms in order for [place] to release information regarding their care.   Consent: Patient/Guardian gives verbal consent for treatment and assignment of benefits for services provided during this visit. Patient/Guardian expressed understanding and agreed to proceed.   Diagnosis: MDD (major depressive disorder), recurrent severe, without psychosis (HCC) [F33.2]    1. MDD (major depressive disorder), recurrent severe, without psychosis (HCC)   2. GAD (generalized anxiety disorder)       Donia Guiles, LCSW

## 2023-03-13 NOTE — Psych (Signed)
Virtual Visit via Video Note  I connected with Andrea Mccarthy on 03/10/23 at  9:00 AM EDT by a video enabled telemedicine application and verified that I am speaking with the correct person using two identifiers.  Location: Patient: patient home Provider: clinical home office   I discussed the limitations of evaluation and management by telemedicine and the availability of in person appointments. The patient expressed understanding and agreed to proceed.  I discussed the assessment and treatment plan with the patient. The patient was provided an opportunity to ask questions and all were answered. The patient agreed with the plan and demonstrated an understanding of the instructions.   The patient was advised to call back or seek an in-person evaluation if the symptoms worsen or if the condition fails to improve as anticipated.  Pt was provided 240 minutes of non-face-to-face time during this encounter.   Donia Guiles, LCSW   [facility] BH PHP THERAPIST PROGRESS NOTE  Andrea Mccarthy [id number]  Session Time: 9:00 - 10:00  Participation Level: Active  Behavioral Response: CasualAlertDepressed  Type of Therapy: Group Therapy  Treatment Goals addressed: Coping  Progress Towards Goals: Progressing  Interventions: CBT, DBT, Supportive, and Reframing  Summary: Andrea Mccarthy is a 22 y.o. adult who presents with depression and anxiety symptoms.  Clinician led check-in regarding current stressors and situation, and review of patient completed daily inventory. Clinician utilized active listening and empathetic response and validated patient emotions. Clinician facilitated processing group on pertinent issues.?   Summary: Patient arrived within time allowed. Patient rates his mood at a 3 on a scale of 1-10 with 10 being best. Pt reports feeling "irritable, tired." Pt states he slept 6 hours off and on and ate 0x. Pt reports he has no appetite, is exhausted, and continues to  feel under the weather. Pt reports he had to work Sat and Sun which exhausted him. Pt states feeling depleted and in need of rest. Pt able to process.?Pt engaged in discussion.?       Session Time: 10:00 am - 11:00 am  Participation Level: Active  Behavioral Response: CasualAlertAnxious and Depressed  Type of Therapy: Group Therapy  Treatment Goals addressed: Coping  Progress Towards Goals: Progressing  Interventions: CBT, DBT, Solution Focused, Strength-based, Supportive, and Reframing  Therapist Response: Cln led processing group for pt's current struggles. Group members shared stressors and provided support and feedback. Cln brought in topics of boundaries, healthy relationships, and unhealthy thought processes to inform discussion.    Therapist Response: Pt able to process and provide support to group.             Session Time: 11:00 -12:00   Participation Level: Active   Behavioral Response: CasualAlertDepressed   Type of Therapy: Group Therapy   Treatment Goals addressed: Coping   Progress Towards Goals: Progressing   Interventions: CBT, DBT, Solution Focused, Strength-based, Supportive, and Reframing   Summary: Cln introduced CBT and the way in which it can provide context for addressing stumbling blocks. Group discussed "the problem is not the problem, the problem is how we're thinking about the problem" and tried to change perspective on current struggles.    Therapist Response: Pt engaged in discussion and is able to attempt reframing using CBT.      Session Time: 12:00 pm - 1:00 pm  Participation Level: Active  Behavioral Response: CasualAlertAnxious and Depressed  Type of Therapy: Group Therapy  Treatment Goals addressed: Coping  Progress Towards Goals: Progressing  Interventions: CBT, DBT, Solution  Focused, Strength-based, Supportive, and Reframing  Therapist Response: 12:00 - 12:50 pm: OT group led by cln E. Hollan 12:50 - 1:00 pm:  Clinician led check-out. Clinician assessed for immediate needs, medication compliance and efficacy, and safety concerns?  Summary: 12:00 - 12:50 pm: Pt participated 12:50 - 1:00 pm: At check-out, patient reports no immediate concerns.??Patient demonstrates progress as evidenced by continued engagement and responsiveness to treatment. Patient denies SI/HI/self-harm thoughts at the end of group.    Suicidal/Homicidal: Nowithout intent/plan  Plan: Pt will continue in PHP while working to decrease depression and anxiety symptoms, increase ability to manage impulsive behaviors, and increase ability to manage symptoms in healthy manner.   Collaboration of Care: Medication Management AEB J. McQuilla  Patient/Guardian was advised Release of Information must be obtained prior to any record release in order to collaborate their care with an outside provider. Patient/Guardian was advised if they have not already done so to contact the registration department to sign all necessary forms in order for [place] to release information regarding their care.   Consent: Patient/Guardian gives verbal consent for treatment and assignment of benefits for services provided during this visit. Patient/Guardian expressed understanding and agreed to proceed.   Diagnosis: MDD (major depressive disorder), recurrent severe, without psychosis (HCC) [F33.2]    1. MDD (major depressive disorder), recurrent severe, without psychosis (HCC)   2. GAD (generalized anxiety disorder)       Donia Guiles, LCSW
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
## 2023-03-14 ENCOUNTER — Ambulatory Visit (INDEPENDENT_AMBULATORY_CARE_PROVIDER_SITE_OTHER): Payer: BC Managed Care – PPO | Admitting: Licensed Clinical Social Worker

## 2023-03-14 DIAGNOSIS — F332 Major depressive disorder, recurrent severe without psychotic features: Secondary | ICD-10-CM

## 2023-03-14 DIAGNOSIS — F411 Generalized anxiety disorder: Secondary | ICD-10-CM

## 2023-03-14 NOTE — Progress Notes (Signed)
Spoke with patient via WebEx video call, used 2 identifiers to correctly identify patient. States that groups are going OK, she is tired this morning because she worked late last night. Lexapro is continuing to make her more anxious, tense, and shaky. She has panic attacks more frequently now. On scale 1-10 as 10 being worst she rates depression at 6 and anxiety at 8. Denies SI/HI or AV hallucinations. No other issues or complaints.

## 2023-03-17 ENCOUNTER — Ambulatory Visit (HOSPITAL_COMMUNITY): Payer: Medicaid Other

## 2023-03-18 ENCOUNTER — Ambulatory Visit (HOSPITAL_COMMUNITY): Payer: Medicaid Other

## 2023-03-20 ENCOUNTER — Ambulatory Visit (HOSPITAL_COMMUNITY): Payer: Medicaid Other

## 2023-03-20 NOTE — Psych (Signed)
Virtual Visit via Video Note  I connected with Andrea Mccarthy on 03/14/23 at  9:00 AM EDT by a video enabled telemedicine application and verified that I am speaking with the correct person using two identifiers.  Location: Patient: patient home Provider: clinical home office   I discussed the limitations of evaluation and management by telemedicine and the availability of in person appointments. The patient expressed understanding and agreed to proceed.  I discussed the assessment and treatment plan with the patient. The patient was provided an opportunity to ask questions and all were answered. The patient agreed with the plan and demonstrated an understanding of the instructions.   The patient was advised to call back or seek an in-person evaluation if the symptoms worsen or if the condition fails to improve as anticipated.  Pt was provided 240 minutes of non-face-to-face time during this encounter.   Donia Guiles, LCSW   [facility] PHP THERAPIST PROGRESS NOTE  Andrea Mccarthy [id number]  Session Time: 9:00 - 10:00  Participation Level: Active  Behavioral Response: CasualAlertDepressed  Type of Therapy: Group Therapy  Treatment Goals addressed: Coping  Progress Towards Goals: Progressing  Interventions: CBT, DBT, Supportive, and Reframing  Summary: Andrea Mccarthy is a 22 y.o. adult who presents with depression and anxiety symptoms.  Clinician led check-in regarding current stressors and situation, and review of patient completed daily inventory. Clinician utilized active listening and empathetic response and validated patient emotions. Clinician facilitated processing group on pertinent issues.?   Summary: Patient arrived within time allowed. Patient rates his mood at a 5 on a scale of 1-10 with 10 being best. Pt reports feeling "tired." Pt states he slept 5 hours and ate 1x. Pt reports he got called in to work yesterday evening and it tired him out "in a good  way."  Pt states struggling with feelings of guilt and processing them. Pt states increased goal directed behaviors. Pt able to process.?Pt engaged in discussion.?        Session Time: 10:00 am - 11:00 am  Participation Level: Active  Behavioral Response: CasualAlertAnxious and Depressed  Type of Therapy: Group Therapy  Treatment Goals addressed: Coping  Progress Towards Goals: Progressing  Interventions: CBT, DBT, Solution Focused, Strength-based, Supportive, and Reframing  Therapist Response:  Cln led discussion on ways to manage stressors and feelings over the weekend. Group members  brainstormed things to do over the weekend for multiple levels of energy, access, and moods. Cln reviewed crisis services should they be needed and provided pt's with the text crisis line, mobile crisis, national suicide hotline, [facility] 24/7 line, and information on [facility].      Therapist Response:  Pt engaged in discussion and is able to identify 3 ideas of what to do over the weekend to keep their mind engaged.          Session Time: 11:00 -12:00   Participation Level: Active   Behavioral Response: CasualAlertDepressed   Type of Therapy: Group Therapy   Treatment Goals addressed: Coping   Progress Towards Goals: Progressing   Interventions: CBT, DBT, Solution Focused, Strength-based, Supportive, and Reframing   Summary: Cln continued topic of CBT cognitive distortions. Cln utilized handout "cognitive distortions" to discuss common examples of distorted thoughts and group members worked to identify examples in their own life.    Therapist Response: Pt engaged in discussion.        Session Time: 12:00 pm - 1:00 pm  Participation Level: Active  Behavioral Response:  CasualAlertAnxious and Depressed  Type of Therapy: Group Therapy  Treatment Goals addressed: Coping  Progress Towards Goals: Progressing  Interventions: CBT, DBT, Solution Focused, Strength-based, Supportive,  and Reframing  Therapist Response: 12:00 - 12:50 pm: OT group led by cln E. Hollan 12:50 - 1:00 pm: Clinician led check-out. Clinician assessed for immediate needs, medication compliance and efficacy, and safety concerns?  Summary: 12:00 - 12:50 pm: Pt participated 12:50 - 1:00 pm: At check-out, patient reports no immediate concerns.??Patient demonstrates progress as evidenced by continued engagement and responsiveness to treatment. Patient denies SI/HI/self-harm thoughts at the end of group.    Suicidal/Homicidal: Nowithout intent/plan  Plan: Pt will discharge from PHP due to meeting treatment goals of decreased depression and anxiety symptoms, increased ability to manage impulsive behaviors, and increased ability to manage symptoms in healthy manner. Pt will step down to individual therapy and psychiatry. Pt and provider are aligned with discharge plan. Pt denies SI/HI at time of discharge.   Collaboration of Care: Medication Management AEB J. McQuilla  Patient/Guardian was advised Release of Information must be obtained prior to any record release in order to collaborate their care with an outside provider. Patient/Guardian was advised if they have not already done so to contact the registration department to sign all necessary forms in order for [place] to release information regarding their care.   Consent: Patient/Guardian gives verbal consent for treatment and assignment of benefits for services provided during this visit. Patient/Guardian expressed understanding and agreed to proceed.   Diagnosis: MDD (major depressive disorder), recurrent severe, without psychosis (HCC) [F33.2]    1. MDD (major depressive disorder), recurrent severe, without psychosis (HCC)   2. GAD (generalized anxiety disorder)       Donia Guiles, LCSW

## 2023-03-21 ENCOUNTER — Ambulatory Visit (HOSPITAL_COMMUNITY): Payer: Medicaid Other

## 2023-03-24 ENCOUNTER — Ambulatory Visit (HOSPITAL_COMMUNITY): Payer: Medicaid Other

## 2023-03-25 ENCOUNTER — Ambulatory Visit (HOSPITAL_COMMUNITY): Payer: Medicaid Other

## 2023-03-27 ENCOUNTER — Ambulatory Visit (HOSPITAL_COMMUNITY): Payer: Medicaid Other

## 2023-03-28 ENCOUNTER — Ambulatory Visit (HOSPITAL_COMMUNITY): Payer: Medicaid Other

## 2023-04-03 DIAGNOSIS — M79641 Pain in right hand: Secondary | ICD-10-CM | POA: Diagnosis not present

## 2023-04-03 DIAGNOSIS — M25531 Pain in right wrist: Secondary | ICD-10-CM | POA: Diagnosis not present

## 2023-10-02 ENCOUNTER — Ambulatory Visit: Payer: Medicaid Other | Admitting: Internal Medicine

## 2023-10-17 DIAGNOSIS — Z111 Encounter for screening for respiratory tuberculosis: Secondary | ICD-10-CM | POA: Diagnosis not present

## 2023-10-17 DIAGNOSIS — Z23 Encounter for immunization: Secondary | ICD-10-CM | POA: Diagnosis not present

## 2023-10-20 DIAGNOSIS — Z111 Encounter for screening for respiratory tuberculosis: Secondary | ICD-10-CM | POA: Diagnosis not present

## 2023-10-20 DIAGNOSIS — Z0279 Encounter for issue of other medical certificate: Secondary | ICD-10-CM | POA: Diagnosis not present

## 2023-11-22 ENCOUNTER — Ambulatory Visit
Admission: EM | Admit: 2023-11-22 | Discharge: 2023-11-22 | Disposition: A | Discharge status: Home / Self Care | Payer: BC Managed Care – PPO | Attending: Family Medicine | Admitting: Family Medicine

## 2023-11-22 ENCOUNTER — Encounter: Payer: Self-pay | Admitting: Emergency Medicine

## 2023-11-22 DIAGNOSIS — R1031 Right lower quadrant pain: Secondary | ICD-10-CM | POA: Diagnosis not present

## 2023-11-22 DIAGNOSIS — K529 Noninfective gastroenteritis and colitis, unspecified: Secondary | ICD-10-CM | POA: Diagnosis not present

## 2023-11-22 LAB — RESP PANEL BY RT-PCR (FLU A&B, COVID) ARPGX2
Influenza A by PCR: NEGATIVE
Influenza B by PCR: NEGATIVE
SARS Coronavirus 2 by RT PCR: NEGATIVE

## 2023-11-22 LAB — GROUP A STREP BY PCR: Group A Strep by PCR: NOT DETECTED

## 2023-11-22 MED ORDER — ONDANSETRON HCL 4 MG/2ML IJ SOLN
4.0000 mg | Freq: Once | INTRAMUSCULAR | Status: AC
  Administered 2023-11-22: 4 mg via INTRAMUSCULAR

## 2023-11-22 NOTE — ED Triage Notes (Signed)
 Patient c/o abdominal pain, headache, sore throat, and fever that started on Thursday.  Patient reports some vomiting.

## 2023-11-22 NOTE — ED Provider Notes (Signed)
 [facility]    CSN: [id number] Arrival date & time: 11/22/23  1452      History   Chief Complaint Chief Complaint  Patient presents with   Abdominal Pain   Headache   Fever    HPI Andrea Mccarthy is a 23 y.o. adult.   HPI  History obtained from the patient. Andrea Mccarthy presents for diarrhea, abdominal cramping, vomiting, nausea, decreased appetite, lightheadedness, sore throat, fever and headache that started Thursday. Tmax 101F.  Took some OTC medications.  No known sick contacts but went out with his mom for her birthday.  Notes that his mom as well. No LMP recorded. (Menstrual status: IUD).        Past Medical History:  Diagnosis Date   Anxiety    Depression    IBS (irritable bowel syndrome)    Migraine     Patient Active Problem List   Diagnosis Date Noted   GAD (generalized anxiety disorder) 03/13/2023   Intentional overdose (HCC) 01/27/2023   Suicidal ideation 01/27/2023   MDD (major depressive disorder), recurrent severe, without psychosis (HCC) 12/03/2022   Other specified anxiety disorders 08/19/2019   Gender dysphoria 08/19/2019    History reviewed. No pertinent surgical history.  OB History   No obstetric history on file.      Home Medications    Prior to Admission medications   Medication Sig Start Date End Date Taking? Authorizing Provider  escitalopram  (LEXAPRO ) 10 MG tablet Take 1 tablet (10 mg total) by mouth daily. 03/12/23   McQuilla, Jai B, MD  hydrOXYzine  (ATARAX ) 25 MG tablet Take 1 tablet (25 mg total) by mouth every 6 (six) hours as needed for anxiety. Patient not taking: Reported on 03/07/2023 01/31/23   Clapacs, Norleen DASEN, MD  traZODone  (DESYREL ) 50 MG tablet Take 0.5 tablets (25 mg total) by mouth at bedtime as needed for sleep. 03/07/23   Juleen Reggie NOVAK, MD    Family History Family History  Problem Relation Age of Onset   Depression Mother    ADD / ADHD Father    ADD / ADHD Sister    Bipolar disorder Maternal Aunt     Alcohol abuse Maternal Uncle    Bipolar disorder Maternal Grandmother     Social History Social History   Tobacco Use   Smoking status: Never   Smokeless tobacco: Never  Vaping Use   Vaping status: Never Used  Substance Use Topics   Alcohol use: No   Drug use: Never     Allergies   Remeron  [mirtazapine ] and Zoloft  [sertraline ]   Review of Systems Review of Systems: negative unless otherwise stated in HPI.      Physical Exam Triage Vital Signs ED Triage Vitals  Encounter Vitals Group     BP 11/22/23 1522 121/75     Systolic BP Percentile --      Diastolic BP Percentile --      Pulse Rate 11/22/23 1522 70     Resp 11/22/23 1522 14     Temp 11/22/23 1522 98.5 F (36.9 C)     Temp Source 11/22/23 1522 Oral     SpO2 11/22/23 1522 98 %     Weight 11/22/23 1520 117 lb 1 oz (53.1 kg)     Height --      Head Circumference --      Peak Flow --      Pain Score 11/22/23 1520 6     Pain Loc --      Pain  Education --      Exclude from [employer] Chart --    No data found.  Updated Vital Signs BP 121/75 (BP Location: Right Arm)   Pulse 70   Temp 98.5 F (36.9 C) (Oral)   Resp 14   Wt 53.1 kg   SpO2 98%   BMI 23.64 kg/m   Visual Acuity Right Eye Distance:   Left Eye Distance:   Bilateral Distance:    Right Eye Near:   Left Eye Near:    Bilateral Near:     Physical Exam GEN:     alert, non-toxic appearing adult in no distress    HENT:  mucus membranes moist, oropharyngeal without lesions, mild erythema, no tonsillar hypertrophy or exudates, no nasal discharge EYES:   pupils equal and reactive, no scleral injection or discharge NECK:  normal ROM, no meningismus   RESP:  no increased work of breathing, clear to auscultation bilaterally CVS:   regular rate and rhythm ABD:    Soft, generalized tenderness but worse in the right lower quadrant, no guarding, rebound Skin:   warm and dry    UC Treatments / Results  Labs (all labs ordered are listed, but  only abnormal results are displayed) Labs Reviewed  GROUP A STREP BY PCR  RESP PANEL BY RT-PCR (FLU A&B, COVID) [id number]    EKG   Radiology No results found.  Procedures Procedures (including critical care time)  Medications Ordered in UC Medications  ondansetron  (ZOFRAN ) injection 4 mg (4 mg Intramuscular Given 11/22/23 1609)    Initial Impression / Assessment and Plan / UC Course  I have reviewed the triage vital signs and the nursing notes.  Pertinent labs & imaging results that were available during my care of the patient were reviewed by me and considered in my medical decision making (see chart for details).       Pt is a 23 y.o. adult who presents for 2-3 days of respiratory symptoms.  Andrea Mccarthy is afebrile here without recent antipyretics.  Discussed Zofran  p.o. versus IM and he chose IM.  Zofran  4 mg IM given.  Satting well on room air. Overall pt is non-toxic appearing, well hydrated, without respiratory distress. Pulmonary exam is unremarkable.  Andrea Mccarthy has generalized abdominal tenderness that is worse in the right lower quadrant.  Influenza and COVID testing was negative.  Strep PCR obtained and was negative.  As I was discussing likely viral illness Andrea Mccarthy was concerned that he may have something more going on as his abdominal pain started around his abdomen and is now more concentrated in his right lower quadrant.  We discussed ED evaluation and he is agreeable.  He will travel to [facility] emergency department for further evaluation.  Return and ED precautions given and voiced understanding. Discussed MDM, treatment plan and plan for follow-up with patient who agrees with plan.     Final Clinical Impressions(s) / UC Diagnoses   Final diagnoses:  RLQ abdominal pain  Gastroenteritis     Discharge Instructions      You strep, Covid and influenza are negative.  I suspect that you have a viral illness.  However, you do have significant right lower quadrant abdominal  pain and I cannot rule out an appendicitis here.  You have been advised to follow up immediately in the emergency department for concerning signs or symptoms as discussed during your visit. If you declined EMS transport, please have a family member take you directly to the ED at this time. Do  not delay.   Based on concerns about condition, if you do not follow up in the ED, you may risk poor outcomes including worsening of condition, delayed treatment and potentially life threatening issues. If you have declined to go to the ED at this time, you should call your PCP immediately to set up a follow up appointment.   Go to ED for red flag symptoms, including; fevers you cannot reduce with Tylenol /Motrin , severe headaches, vision changes, numbness/weakness in part of the body, lethargy, confusion, intractable vomiting, severe dehydration, chest pain, breathing difficulty, severe persistent abdominal or pelvic pain, signs of severe infection (increased redness, swelling of an area), feeling faint or passing out, dizziness, etc. You should especially go to the ED for sudden acute worsening of condition if you do not elect to go at this time.       ED Prescriptions   None    PDMP not reviewed this encounter.   Sussie Minor, DO 11/22/23 1915

## 2023-11-22 NOTE — ED Notes (Signed)
 Patient is being discharged from the Urgent Care and sent to the [facility] Emergency Department via private vehicle . Per Dr. Kriste, patient is in need of higher level of care due to abdominal pain and vomiting. Patient is aware and verbalizes understanding of plan of care.  Vitals:   11/22/23 1522  BP: 121/75  Pulse: 70  Resp: 14  Temp: 98.5 F (36.9 C)  SpO2: 98%

## 2023-11-22 NOTE — Discharge Instructions (Addendum)
 You strep, Covid and influenza are negative.  I suspect that you have a viral illness.  However, you do have significant right lower quadrant abdominal pain and I cannot rule out an appendicitis here.  You have been advised to follow up immediately in the emergency department for concerning signs or symptoms as discussed during your visit. If you declined EMS transport, please have a family member take you directly to the ED at this time. Do not delay.   Based on concerns about condition, if you do not follow up in the ED, you may risk poor outcomes including worsening of condition, delayed treatment and potentially life threatening issues. If you have declined to go to the ED at this time, you should call your PCP immediately to set up a follow up appointment.   Go to ED for red flag symptoms, including; fevers you cannot reduce with Tylenol /Motrin , severe headaches, vision changes, numbness/weakness in part of the body, lethargy, confusion, intractable vomiting, severe dehydration, chest pain, breathing difficulty, severe persistent abdominal or pelvic pain, signs of severe infection (increased redness, swelling of an area), feeling faint or passing out, dizziness, etc. You should especially go to the ED for sudden acute worsening of condition if you do not elect to go at this time.

## 2023-11-23 ENCOUNTER — Emergency Department
Admission: EM | Admit: 2023-11-23 | Discharge: 2023-11-23 | Disposition: A | Discharge status: Home / Self Care | Payer: BC Managed Care – PPO | Attending: Emergency Medicine | Admitting: Emergency Medicine

## 2023-11-23 ENCOUNTER — Emergency Department: Payer: BC Managed Care – PPO

## 2023-11-23 DIAGNOSIS — R1030 Lower abdominal pain, unspecified: Secondary | ICD-10-CM | POA: Diagnosis present

## 2023-11-23 DIAGNOSIS — R1031 Right lower quadrant pain: Secondary | ICD-10-CM | POA: Diagnosis not present

## 2023-11-23 DIAGNOSIS — K529 Noninfective gastroenteritis and colitis, unspecified: Secondary | ICD-10-CM | POA: Diagnosis not present

## 2023-11-23 LAB — CBC
HCT: 40.1 % (ref 36.0–46.0)
Hemoglobin: 13.2 g/dL (ref 12.0–15.0)
MCH: 30.8 pg (ref 26.0–34.0)
MCHC: 32.9 g/dL (ref 30.0–36.0)
MCV: 93.5 fL (ref 80.0–100.0)
Platelets: 233 10*3/uL (ref 150–400)
RBC: 4.29 MIL/uL (ref 3.87–5.11)
RDW: 13.4 % (ref 11.5–15.5)
WBC: 5.5 10*3/uL (ref 4.0–10.5)
nRBC: 0 % (ref 0.0–0.2)

## 2023-11-23 LAB — URINALYSIS, ROUTINE W REFLEX MICROSCOPIC
Bilirubin Urine: NEGATIVE
Glucose, UA: NEGATIVE mg/dL
Hgb urine dipstick: NEGATIVE
Ketones, ur: NEGATIVE mg/dL
Nitrite: NEGATIVE
Protein, ur: 30 mg/dL — AB
Specific Gravity, Urine: 1.026 (ref 1.005–1.030)
pH: 5 (ref 5.0–8.0)

## 2023-11-23 LAB — COMPREHENSIVE METABOLIC PANEL
ALT: 13 U/L (ref 0–44)
AST: 17 U/L (ref 15–41)
Albumin: 4 g/dL (ref 3.5–5.0)
Alkaline Phosphatase: 44 U/L (ref 38–126)
Anion gap: 5 (ref 5–15)
BUN: 13 mg/dL (ref 6–20)
CO2: 26 mmol/L (ref 22–32)
Calcium: 8.9 mg/dL (ref 8.9–10.3)
Chloride: 104 mmol/L (ref 98–111)
Creatinine, Ser: 0.9 mg/dL (ref 0.44–1.00)
GFR, Estimated: >60 mL/min (ref >60)
Glucose, Bld: 94 mg/dL (ref 70–99)
Potassium: 4.3 mmol/L (ref 3.5–5.1)
Sodium: 135 mmol/L (ref 135–145)
Total Bilirubin: 0.8 mg/dL (ref 0.0–1.2)
Total Protein: 7 g/dL (ref 6.5–8.1)

## 2023-11-23 LAB — PREGNANCY, URINE: Preg Test, Ur: NEGATIVE

## 2023-11-23 LAB — LIPASE, BLOOD: Lipase: 33 U/L (ref 11–51)

## 2023-11-23 MED ORDER — SODIUM CHLORIDE 0.9 % IV SOLN: Freq: Once | INTRAVENOUS | Status: AC

## 2023-11-23 MED ORDER — DIPHENHYDRAMINE HCL 50 MG/ML IJ SOLN
25.0000 mg | Freq: Once | INTRAMUSCULAR | Status: AC
  Administered 2023-11-23: 25 mg via INTRAVENOUS
  Filled 2023-11-23: qty 1

## 2023-11-23 MED ORDER — PROMETHAZINE HCL 12.5 MG PO TABS: 12.5000 mg | ORAL_TABLET | Freq: Four times a day (QID) | ORAL | 0 refills | Status: AC | PRN

## 2023-11-23 MED ORDER — ONDANSETRON HCL 4 MG/2ML IJ SOLN
4.0000 mg | Freq: Once | INTRAMUSCULAR | Status: AC
  Administered 2023-11-23: 4 mg via INTRAVENOUS
  Filled 2023-11-23: qty 2

## 2023-11-23 MED ORDER — IOHEXOL 300 MG/ML  SOLN
100.0000 mL | Freq: Once | INTRAMUSCULAR | Status: AC | PRN
  Administered 2023-11-23: 100 mL via INTRAVENOUS

## 2023-11-23 NOTE — ED Notes (Signed)
 Pt noticing rash on arm after zofran administration - EDP made aware

## 2023-11-23 NOTE — ED Triage Notes (Signed)
 Pt c/o RLQ abdominal pain, n/v/d, headache, and sore throat x3 days.  Pain score 6/10 worsening w/ ambulation.     Pt was seen at [facility] yesterday or same and referred to ED.  Resp Panel was negative.

## 2023-11-23 NOTE — ED Provider Notes (Signed)
 [facility] Provider Note    Event Date/Time   First MD Initiated Contact with Patient 11/23/23 1122     (approximate)   History   Abdominal Pain, Emesis, and Diarrhea   HPI  Andrea Mccarthy is a 23 y.o. adult who presents with complaints of abdominal pain, nausea and diarrhea for approximately 3 to 4 days.  Went to urgent care but symptoms not improved, complains of lower abdominal cramping pain.  No dysuria reported     Physical Exam   Triage Vital Signs: ED Triage Vitals  Encounter Vitals Group     BP 11/23/23 0902 107/76     Systolic BP Percentile --      Diastolic BP Percentile --      Pulse Rate 11/23/23 0902 79     Resp 11/23/23 0902 16     Temp 11/23/23 0902 (!) 97.5 F (36.4 C)     Temp Source 11/23/23 0902 Oral     SpO2 11/23/23 0902 100 %     Weight 11/23/23 0903 53.1 kg (117 lb)     Height 11/23/23 0903 1.499 m (4' 11)     Head Circumference --      Peak Flow --      Pain Score 11/23/23 0903 6     Pain Loc --      Pain Education --      Exclude from Growth Chart --     Most recent vital signs: Vitals:   11/23/23 0902  BP: 107/76  Pulse: 79  Resp: 16  Temp: (!) 97.5 F (36.4 C)  SpO2: 100%     General: Awake, no distress.  CV:  Good peripheral perfusion.  Resp:  Normal effort.  Abd:  No distention.  Mild tenderness right lower quadrant as well as left lower quadrant, no CVA tenderness Other:     ED Results / Procedures / Treatments   Labs (all labs ordered are listed, but only abnormal results are displayed) Labs Reviewed  URINALYSIS, ROUTINE W REFLEX MICROSCOPIC - Abnormal; Notable for the following components:      Result Value   Color, Urine YELLOW (*)    APPearance HAZY (*)    Protein, ur 30 (*)    Leukocytes,Ua TRACE (*)    Bacteria, UA RARE (*)    All other components within normal limits  LIPASE, BLOOD  COMPREHENSIVE METABOLIC PANEL  CBC  PREGNANCY, URINE  POC URINE PREG, ED      EKG     RADIOLOGY CT abdomen pelvis viewed interpret by me, no acute abnormality, confirmed by radiology    PROCEDURES:  Critical Care performed:   Procedures   MEDICATIONS ORDERED IN ED: Medications  0.9 %  sodium chloride  infusion (0 mLs Intravenous Stopped 11/23/23 1343)  ondansetron  (ZOFRAN ) injection 4 mg (4 mg Intravenous Given 11/23/23 1213)  diphenhydrAMINE  (BENADRYL ) injection 25 mg (25 mg Intravenous Given 11/23/23 1238)  iohexol  (OMNIPAQUE ) 300 MG/ML solution 100 mL (100 mLs Intravenous Contrast Given 11/23/23 1249)     IMPRESSION / MDM / ASSESSMENT AND PLAN / ED COURSE  I reviewed the triage vital signs and the nursing notes. Patient's presentation is most consistent with acute presentation with potential threat to life or bodily function.  Patient presents with nausea vomiting diarrhea abdominal pain as detailed above, differential includes viral gastroenteritis which is prevalent in the community this time versus possible appendicitis given location of pain.  Lab work overall reassuring, patient is not pregnant  Treated  with IV morphine, IV Zofran , will obtain CT abdomen pelvis and reevaluate  Patient had several urticaria just proximal to IV after Zofran  consistent with allergic reaction, have documented this, she did receive IV Benadryl , has been doing well  CT scan is negative for appendicitis, no indication for admission, appropriate for discharge home, will prescribe p.o. Phenergan , work note provided        FINAL CLINICAL IMPRESSION(S) / ED DIAGNOSES   Final diagnoses:  Gastroenteritis     Rx / DC Orders   ED Discharge Orders          Ordered    promethazine  (PHENERGAN ) 12.5 MG tablet  Every 6 hours PRN        11/23/23 1337             Note:  This document was prepared using Dragon voice recognition software and may include unintentional dictation errors.   Arlander Charleston, MD 11/23/23 1349

## 2024-01-30 ENCOUNTER — Emergency Department

## 2024-01-30 ENCOUNTER — Emergency Department
Admission: EM | Admit: 2024-01-30 | Discharge: 2024-01-30 | Disposition: A | Discharge status: Home / Self Care | Attending: Emergency Medicine | Admitting: Emergency Medicine

## 2024-01-30 ENCOUNTER — Other Ambulatory Visit: Payer: Self-pay

## 2024-01-30 DIAGNOSIS — S199XXA Unspecified injury of neck, initial encounter: Secondary | ICD-10-CM | POA: Diagnosis not present

## 2024-01-30 DIAGNOSIS — R569 Unspecified convulsions: Secondary | ICD-10-CM | POA: Diagnosis not present

## 2024-01-30 DIAGNOSIS — R519 Headache, unspecified: Secondary | ICD-10-CM | POA: Diagnosis not present

## 2024-01-30 DIAGNOSIS — R270 Ataxia, unspecified: Secondary | ICD-10-CM | POA: Diagnosis not present

## 2024-01-30 DIAGNOSIS — S0990XA Unspecified injury of head, initial encounter: Secondary | ICD-10-CM | POA: Diagnosis not present

## 2024-01-30 LAB — BASIC METABOLIC PANEL
Anion gap: 8 (ref 5–15)
BUN: 16 mg/dL (ref 6–20)
CO2: 23 mmol/L (ref 22–32)
Calcium: 9.4 mg/dL (ref 8.9–10.3)
Chloride: 106 mmol/L (ref 98–111)
Creatinine, Ser: 0.7 mg/dL (ref 0.44–1.00)
GFR, Estimated: >60 mL/min (ref >60)
Glucose, Bld: 88 mg/dL (ref 70–99)
Potassium: 4.1 mmol/L (ref 3.5–5.1)
Sodium: 137 mmol/L (ref 135–145)

## 2024-01-30 LAB — CBC
HCT: 40.1 % (ref 36.0–46.0)
Hemoglobin: 13.4 g/dL (ref 12.0–15.0)
MCH: 31.4 pg (ref 26.0–34.0)
MCHC: 33.4 g/dL (ref 30.0–36.0)
MCV: 93.9 fL (ref 80.0–100.0)
Platelets: 237 10*3/uL (ref 150–400)
RBC: 4.27 MIL/uL (ref 3.87–5.11)
RDW: 12.6 % (ref 11.5–15.5)
WBC: 7.2 10*3/uL (ref 4.0–10.5)
nRBC: 0 % (ref 0.0–0.2)

## 2024-01-30 NOTE — ED Provider Notes (Signed)
 [facility] Provider Note    Event Date/Time   First MD Initiated Contact with Patient 01/30/24 1911     (approximate)   History   Seizure like episode   HPI  Andrea Mccarthy is a 23 y.o. adult   who presents to the emergency department today because of concerns for seizure-like episode.  Patient was at work and started to feel somewhat foggy in the brain and then remembers waking up on the ground.  Apparently coworkers noticed some shaking.  Patient is complaining of some headache and a knot to the forehead. Similar episodes have happened twice before. Denies any recent illness.     Physical Exam   Triage Vital Signs: ED Triage Vitals  Encounter Vitals Group     BP 01/30/24 1702 126/79     Systolic BP Percentile --      Diastolic BP Percentile --      Pulse Rate 01/30/24 1702 89     Resp 01/30/24 1702 16     Temp 01/30/24 1702 97.9 F (36.6 C)     Temp src --      SpO2 01/30/24 1702 100 %     Weight 01/30/24 1703 94 lb (42.6 kg)     Height 01/30/24 1703 4\' 11"  (1.499 m)     Head Circumference --      Peak Flow --      Pain Score 01/30/24 1703 4     Pain Loc --      Pain Education --      Exclude from Growth Chart --     Most recent vital signs: Vitals:   01/30/24 1702  BP: 126/79  Pulse: 89  Resp: 16  Temp: 97.9 F (36.6 C)  SpO2: 100%   General: Awake, alert, oriented. CV:  Good peripheral perfusion. Regular rate and rhythm. Resp:  Normal effort. Lungs clear. Abd:  No distention.   ED Results / Procedures / Treatments   Labs (all labs ordered are listed, but only abnormal results are displayed) Labs Reviewed  CBC  BASIC METABOLIC PANEL     EKG  None   RADIOLOGY I independently interpreted and visualized the CT cervical spine. My interpretation: No fracture Radiology interpretation:  IMPRESSION:  Nonspecific reversal of the usual cervical lordosis. No acute  displaced fractures are identified.   I  independently interpreted and visualized the CT head. My interpretation: No ICH Radiology interpretation:  IMPRESSION:  No acute intracranial abnormalities.    PROCEDURES:  Critical Care performed: No   MEDICATIONS ORDERED IN ED: Medications - No data to display   IMPRESSION / MDM / ASSESSMENT AND PLAN / ED COURSE  I reviewed the triage vital signs and the nursing notes.                              Differential diagnosis includes, but is not limited to, syncope, seizure, anemia, electrolyte abnormality  Patient's presentation is most consistent with acute presentation with potential threat to life or bodily function.   Patient presented to the emergency department today because of concerns for seizure-like episode.  On exam patient is awake and alert.  The patient blood work without concerning electrolyte abnormality or leukocytosis.  CT head and cervical spine without abnormalities.  At this time I do wonder if patient might of had a vasovagal episode with some seizure-like activity.  Patient was not incontinent nor did she bite her  tongue.  Given reassuring workup.  Think it is reasonable for patient be discharged.  Discussed concussion precautions.      FINAL CLINICAL IMPRESSION(S) / ED DIAGNOSES   Final diagnoses:  Seizure-like activity (HCC)     Note:  This document was prepared using Dragon voice recognition software and may include unintentional dictation errors.    Phineas Semen, MD 01/30/24 2154

## 2024-01-30 NOTE — ED Triage Notes (Addendum)
 Pt to ED From work for possible seizure. States thinks has had 2 seizures before. Denies taking meds.  Denies loss of bladder control.  Reports hitting head Alert and oriented.

## 2024-05-10 DIAGNOSIS — Z0184 Encounter for antibody response examination: Secondary | ICD-10-CM | POA: Diagnosis not present

## 2024-05-19 DIAGNOSIS — R42 Dizziness and giddiness: Secondary | ICD-10-CM | POA: Diagnosis not present

## 2024-05-19 DIAGNOSIS — Z3202 Encounter for pregnancy test, result negative: Secondary | ICD-10-CM | POA: Diagnosis not present

## 2024-05-19 DIAGNOSIS — R1031 Right lower quadrant pain: Secondary | ICD-10-CM | POA: Diagnosis not present

## 2024-05-20 ENCOUNTER — Ambulatory Visit: Admission: RE | Admit: 2024-05-20 | Discharge: 2024-05-20 | Source: Ambulatory Visit

## 2024-05-20 ENCOUNTER — Other Ambulatory Visit: Payer: Self-pay

## 2024-05-20 ENCOUNTER — Emergency Department

## 2024-05-20 ENCOUNTER — Emergency Department: Admission: EM | Admit: 2024-05-20 | Discharge: 2024-05-20 | Disposition: A | Discharge status: Home / Self Care

## 2024-05-20 VITALS — BP 113/71 | HR 79 | Temp 101.4°F | Resp 19

## 2024-05-20 DIAGNOSIS — K921 Melena: Secondary | ICD-10-CM | POA: Diagnosis not present

## 2024-05-20 DIAGNOSIS — R198 Other specified symptoms and signs involving the digestive system and abdomen: Secondary | ICD-10-CM | POA: Diagnosis not present

## 2024-05-20 DIAGNOSIS — R509 Fever, unspecified: Secondary | ICD-10-CM

## 2024-05-20 DIAGNOSIS — R109 Unspecified abdominal pain: Secondary | ICD-10-CM | POA: Diagnosis not present

## 2024-05-20 DIAGNOSIS — K5900 Constipation, unspecified: Secondary | ICD-10-CM | POA: Insufficient documentation

## 2024-05-20 DIAGNOSIS — R1012 Left upper quadrant pain: Secondary | ICD-10-CM | POA: Diagnosis present

## 2024-05-20 LAB — URINALYSIS, ROUTINE W REFLEX MICROSCOPIC
Bilirubin Urine: NEGATIVE
Glucose, UA: NEGATIVE mg/dL
Hgb urine dipstick: NEGATIVE
Ketones, ur: NEGATIVE mg/dL
Leukocytes,Ua: NEGATIVE
Nitrite: NEGATIVE
Protein, ur: NEGATIVE mg/dL
Specific Gravity, Urine: 1.033 — ABNORMAL HIGH (ref 1.005–1.030)
pH: 6 (ref 5.0–8.0)

## 2024-05-20 LAB — COMPREHENSIVE METABOLIC PANEL WITH GFR
ALT: 12 U/L (ref 0–44)
AST: 17 U/L (ref 15–41)
Albumin: 4.3 g/dL (ref 3.5–5.0)
Alkaline Phosphatase: 42 U/L (ref 38–126)
Anion gap: 7 (ref 5–15)
BUN: 11 mg/dL (ref 6–20)
CO2: 26 mmol/L (ref 22–32)
Calcium: 9.5 mg/dL (ref 8.9–10.3)
Chloride: 105 mmol/L (ref 98–111)
Creatinine, Ser: 0.55 mg/dL (ref 0.44–1.00)
GFR, Estimated: >60 mL/min (ref >60)
Glucose, Bld: 97 mg/dL (ref 70–99)
Potassium: 3.5 mmol/L (ref 3.5–5.1)
Sodium: 138 mmol/L (ref 135–145)
Total Bilirubin: 0.6 mg/dL (ref 0.0–1.2)
Total Protein: 7.5 g/dL (ref 6.5–8.1)

## 2024-05-20 LAB — CBC
HCT: 38.4 % (ref 36.0–46.0)
Hemoglobin: 12.6 g/dL (ref 12.0–15.0)
MCH: 30.7 pg (ref 26.0–34.0)
MCHC: 32.8 g/dL (ref 30.0–36.0)
MCV: 93.7 fL (ref 80.0–100.0)
Platelets: 204 K/uL (ref 150–400)
RBC: 4.1 MIL/uL (ref 3.87–5.11)
RDW: 12.7 % (ref 11.5–15.5)
WBC: 4.7 K/uL (ref 4.0–10.5)
nRBC: 0 % (ref 0.0–0.2)

## 2024-05-20 LAB — LIPASE, BLOOD: Lipase: 36 U/L (ref 11–51)

## 2024-05-20 LAB — POC URINE PREG, ED: Preg Test, Ur: NEGATIVE

## 2024-05-20 MED ORDER — IOHEXOL 300 MG/ML  SOLN
80.0000 mL | Freq: Once | INTRAMUSCULAR | Status: AC | PRN
  Administered 2024-05-20: 80 mL via INTRAVENOUS

## 2024-05-20 MED ORDER — KETOROLAC TROMETHAMINE 15 MG/ML IJ SOLN
15.0000 mg | Freq: Once | INTRAMUSCULAR | Status: AC
  Administered 2024-05-20: 15 mg via INTRAVENOUS
  Filled 2024-05-20: qty 1

## 2024-05-20 NOTE — ED Triage Notes (Signed)
 Combined with upper and lower abdominal pain tender especially in middle of lower; started to release bloody mucus instead of stool and needed to strain. Have had to rely on laxatives and cannot sleep from pain, also get bloated after eating anything - Entered by patient  Started with constipation. Taking fiber and laxatives. Sx x 2 weeks. Gets bloated when eats.

## 2024-05-20 NOTE — ED Provider Notes (Signed)
 [facility] Emergency Department Provider Note     Event Date/Time   First MD Initiated Contact with Patient 05/20/24 1733     (approximate)   History   Abdominal Pain   HPI  Andrea Mccarthy is a 23 y.o. adult with a history of anxiety, diverticulitis and IBS presents to the ED sent from urgent care for evaluation of generalized abdominal pain more specific to left upper quadrant and suprapubic area.  Associated symptoms include subjective fever highest of 101 with onset of yesterday.  Patient states constipation for 2 weeks and noting mucus in stool without making a bowel movement.  Endorses nausea.  Has been using laxative with no relief.  Denies sick contacts, urinary symptoms, new medications, travel.  No surgical history.  No other complaint.     Physical Exam   Triage Vital Signs: ED Triage Vitals  Encounter Vitals Group     BP 05/20/24 1537 112/74     Girls Systolic BP Percentile --      Girls Diastolic BP Percentile --      Boys Systolic BP Percentile --      Boys Diastolic BP Percentile --      Pulse Rate 05/20/24 1537 85     Resp 05/20/24 1537 20     Temp 05/20/24 1537 99 F (37.2 C)     Temp Source 05/20/24 1537 Oral     SpO2 05/20/24 1537 100 %     Weight 05/20/24 1536 96 lb (43.5 kg)     Height 05/20/24 1536 4' 11 (1.499 m)     Head Circumference --      Peak Flow --      Pain Score 05/20/24 1536 7     Pain Loc --      Pain Education --      Exclude from Growth Chart --     Most recent vital signs: Vitals:   05/20/24 2014 05/20/24 2044  BP:    Pulse:  66  Resp:  18  Temp: 98.9 F (37.2 C) 98.7 F (37.1 C)  SpO2:  97%   General Awake, no distress.  HEENT NCAT.  CV:  Good peripheral perfusion.  RESP:  Normal effort.  ABD:  No distention.  NBS x 4.  Soft.  Tenderness with guarding in the left upper, left lower and suprapubic region of abdomen.  DRE performed.  No stool in rectal vault.  External hemorrhoid noted.    ED Results / Procedures / Treatments   Labs (all labs ordered are listed, but only abnormal results are displayed) Labs Reviewed  URINALYSIS, ROUTINE W REFLEX MICROSCOPIC - Abnormal; Notable for the following components:      Result Value   Color, Urine YELLOW (*)    APPearance CLEAR (*)    Specific Gravity, Urine 1.033 (*)    All other components within normal limits  LIPASE, BLOOD  COMPREHENSIVE METABOLIC PANEL WITH GFR  CBC  POC URINE PREG, ED   RADIOLOGY  I personally viewed and evaluated these images as part of my medical decision making, as well as reviewing the written report by the radiologist.  ED Provider Interpretation: No acute abdomen or pelvic abnormality  CT ABDOMEN PELVIS W CONTRAST Result Date: 05/20/2024 CLINICAL DATA:  Acute generalized abdominal pain.  Constipation. EXAM: CT ABDOMEN AND PELVIS WITH CONTRAST TECHNIQUE: Multidetector CT imaging of the abdomen and pelvis was performed using the standard protocol following bolus administration of intravenous contrast. RADIATION DOSE REDUCTION:  This exam was performed according to the departmental dose-optimization program which includes automated exposure control, adjustment of the mA and/or kV according to patient size and/or use of iterative reconstruction technique. CONTRAST:  80mL OMNIPAQUE  IOHEXOL  300 MG/ML  SOLN COMPARISON:  November 23, 2023. FINDINGS: Lower chest: No acute abnormality. Hepatobiliary: No focal liver abnormality is seen. No gallstones, gallbladder wall thickening, or biliary dilatation. Pancreas: Unremarkable. No pancreatic ductal dilatation or surrounding inflammatory changes. Spleen: Normal in size without focal abnormality. Adrenals/Urinary Tract: Adrenal glands are unremarkable. Kidneys are normal, without renal calculi, focal lesion, or hydronephrosis. Bladder is unremarkable. Stomach/Bowel: Stomach is within normal limits. Appendix appears normal. No evidence of bowel wall thickening, distention,  or inflammatory changes. Vascular/Lymphatic: No significant vascular findings are present. No enlarged abdominal or pelvic lymph nodes. Reproductive: Intrauterine device is noted. No adnexal abnormality is noted Other: Small amount of free fluid is noted in the pelvis which most likely is physiologic. No hernia is noted. Musculoskeletal: No acute or significant osseous findings. IMPRESSION: No significant abnormality seen in the abdomen or pelvis. Electronically Signed   By: Lynwood Landy Raddle M.D.   On: 05/20/2024 19:33    PROCEDURES:  Critical Care performed: No  Procedures   MEDICATIONS ORDERED IN ED: Medications  ketorolac  (TORADOL ) 15 MG/ML injection 15 mg (15 mg Intravenous Given 05/20/24 1909)  iohexol  (OMNIPAQUE ) 300 MG/ML solution 80 mL (80 mLs Intravenous Contrast Given 05/20/24 1919)    IMPRESSION / MDM / ASSESSMENT AND PLAN / ED COURSE  I reviewed the triage vital signs and the nursing notes.                              Clinical Course as of 05/20/24 2203  Thu May 20, 2024  2024 CT ABDOMEN PELVIS W CONTRAST IMPRESSION: No significant abnormality seen in the abdomen or pelvis.   [MH]    Clinical Course User Index [MH] Margrette Monte A, PA-C    23 y.o. adult presents to the emergency department for evaluation and treatment of abdominal pain. See HPI for further details.   Differential diagnosis includes, but is not limited to constipation, fecal impaction, diverticulitis, urinary tract infection,  bowel obstruction, colitis, gastroenteritis, IBS flare  Patient's presentation is most consistent with acute complicated illness / injury requiring diagnostic workup.  Lab work obtained in triage is reassuring.  Normal lipase.  Urinalysis is negative.  Negative pregnancy test.  Will obtain CT abdomen and pelvis for further investigation.  Pain management with IM Toradol .   ----------------------------------------- 8:25 PM on  05/20/2024 -----------------------------------------  CT scan results updated to patient.  DRE does not reveal indication for disimpaction.  Advised OTC MiraLAX and magnesium  citrate.  Instructions on how to use these medications provided to the patient.  I have also encouraged close follow-up with GI for further evaluation of ongoing constipation as patient has not been evaluated by GI specialist.  Patient is in agreement with this care plan.  At this time patient is in stable condition for discharge home.  Strict ED return precautions discussed.  FINAL CLINICAL IMPRESSION(S) / ED DIAGNOSES   Final diagnoses:  Abdominal pain, unspecified abdominal location  Constipation, unspecified constipation type   Rx / DC Orders   ED Discharge Orders     None        Note:  This document was prepared using Dragon voice recognition software and may include unintentional dictation errors.    Margrette, Lulla Linville A,  PA-C 05/20/24 2203    Clarine Ozell LABOR, MD 05/25/24 2322

## 2024-05-20 NOTE — ED Triage Notes (Signed)
 Patient sent over from [facility] for upper and lower abdominal pain, constipation and fever of 101.

## 2024-05-20 NOTE — ED Notes (Signed)
 Patient is being discharged from the Urgent Care and sent to the Emergency Department via POV . Per Raspet, Rocky POUR, PA-C , patient is in need of higher level of care due to Abdominal pain,bloody stool,fever . Patient is aware and verbalizes understanding of plan of care.  Vitals:   05/20/24 1227  BP: 113/71  Pulse: 79  Resp: 19  Temp: (!) 101.4 F (38.6 C)  SpO2: 99%

## 2024-05-20 NOTE — Discharge Instructions (Addendum)
  You were evaluated in the ED for constipation.  Your physical exam findings are overall reassuring.  Your CT abdomen pelvis is normal.  Use MiraLAX as directed on the package, once daily.  You can mix this powder with 4 to 8 ounces of water or juice every hour until your next bowel movement.  Consider OTC magnesium  citrate.  Take half of the bottle this evening, wait 4 to 6 hours to see if you have a bowel movement.  If you do not have a bowel movement by tomorrow morning, take the remaining half bottle.  Do not take more than 1 full bottle in 24 hours.  You can also try at saline enema that you can purchase over-the-counter at your local pharmacy as well.  Get plenty of rest and increase your fluid intake.  Call and schedule an appointment with Dr. Maryruth -GI specialist tomorrow for further evaluation.    Return to the ED for any symptoms discussed including vomiting or blood in stool.

## 2024-05-20 NOTE — Discharge Instructions (Signed)
 Since you are having a severe abdominal pain with fever and bloody stool I would like you to go to the emergency room for further evaluation and management as we discussed.

## 2024-05-20 NOTE — ED Provider Notes (Signed)
 [facility]    CSN: [id number] Arrival date & time: 05/20/24  1152      History   Chief Complaint Chief Complaint  Patient presents with   Constipation    Combined with upper and lower abdominal pain tender especially in middle of lower; started to release bloody mucus instead of stool and needed to strain. Have had to rely on laxatives and cannot sleep from pain, also get bloated after eating anything - Entered by patient   Abdominal Pain    HPI Andrea Mccarthy is a 23 y.o. adult.   Andrea Mccarthy presents today with a several week history of change in bowel habits.  Reports that he has had difficulty passing stool without the use of a laxative and for the past week has had difficulty passing anything besides some bloody mucus.  During triage she was noted to have a fever but has not measured a fever at home.  Reports nausea without vomiting.  Does have a history of diverticulitis but denies history of ulcerative colitis, Crohn's disease, other gastrointestinal disorder.  Patient is a trans female but denies any concern for pregnancy.  Has been pushing fluids and reports some associated urinary frequency but denies any dysuria or urinary urgency.  Denies any recent antibiotics, travel, suspicious food intake.  Has been using laxative and increasing fiber to encourage bowel movement but has not tried additional over-the-counter medications.  Reports that pain is rated 7 on a 0-10 pain scale, generalized throughout abdomen but worse in epigastrium and suprapubic region, no aggravating relieving factors identified.  Denies history of abdominal surgery and still has gallbladder/appendix.    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 No pertinent surgical history.  OB History   No obstetric history on file.      Home Medications    Prior to Admission medications   Medication Sig Start Date End Date Taking? Authorizing Provider  escitalopram  (LEXAPRO ) 10 MG tablet Take 1 tablet (10 mg total) by mouth daily. 03/12/23   McQuilla, Jai B, MD  hydrOXYzine  (ATARAX ) 25 MG tablet Take 1 tablet (25 mg total) by mouth every 6 (six) hours as needed for anxiety. Patient not taking: Reported on 03/07/2023 01/31/23   Clapacs, Norleen DASEN, MD  promethazine  (PHENERGAN ) 12.5 MG tablet Take 1 tablet (12.5 mg total) by mouth every 6 (six) hours as needed for nausea or vomiting. 11/23/23   Arlander Charleston, MD  traZODone  (DESYREL ) 50 MG tablet Take 0.5 tablets (25 mg total) by mouth at bedtime as needed for sleep. 03/07/23   Juleen Reggie NOVAK, MD    Family History Family History  Problem Relation Age of Onset   Depression Mother    ADD / ADHD Father    ADD / ADHD Sister    Bipolar disorder Maternal Aunt    Alcohol abuse Maternal Uncle    Bipolar disorder Maternal Grandmother     Social History Social History   Tobacco Use   Smoking status: Never   Smokeless tobacco: Never  Vaping Use   Vaping  status: Never Used  Substance Use Topics   Alcohol use: No   Drug use: Never     Allergies   Sertraline , Remeron  [mirtazapine ], and Zofran  [ondansetron  hcl]   Review of Systems Review of Systems  Constitutional:  Positive for activity change, appetite change, fatigue and fever.  Gastrointestinal:  Positive for abdominal pain, blood in stool, constipation and nausea. Negative for diarrhea and vomiting.  Genitourinary:  Positive for frequency. Negative for dysuria, pelvic pain and urgency.     Physical Exam Triage Vital Signs ED Triage Vitals  Encounter Vitals  Group     BP 05/20/24 1227 113/71     Girls Systolic BP Percentile --      Girls Diastolic BP Percentile --      Boys Systolic BP Percentile --      Boys Diastolic BP Percentile --      Pulse Rate 05/20/24 1227 79     Resp 05/20/24 1227 19     Temp 05/20/24 1227 (!) 101.4 F (38.6 C)     Temp Source 05/20/24 1227 Oral     SpO2 05/20/24 1227 99 %     Weight --      Height --      Head Circumference --      Peak Flow --      Pain Score 05/20/24 1226 7     Pain Loc --      Pain Education --      Exclude from Growth Chart --    No data found.  Updated Vital Signs BP 113/71 (BP Location: Left Arm)   Pulse 79   Temp (!) 101.4 F (38.6 C) (Oral) Comment: no meds today  Resp 19   SpO2 99%   Visual Acuity Right Eye Distance:   Left Eye Distance:   Bilateral Distance:    Right Eye Near:   Left Eye Near:    Bilateral Near:     Physical Exam Vitals reviewed.  Constitutional:      General: He is awake.     Appearance: Normal appearance. He is well-developed. He is ill-appearing.     Comments: Appears stated age, no acute distress, holding abdomen while sitting on exam room table  HENT:     Head: Normocephalic and atraumatic.     Mouth/Throat:     Mouth: Mucous membranes are moist.     Pharynx: Uvula midline. No oropharyngeal exudate or posterior oropharyngeal erythema.  Cardiovascular:     Rate and Rhythm: Normal rate and regular rhythm.     Heart sounds: Normal heart sounds, S1 normal and S2 normal. No murmur heard. Pulmonary:     Effort: Pulmonary effort is normal.     Breath sounds: Normal breath sounds. No stridor. No wheezing, rhonchi or rales.     Comments: Clear to auscultation bilaterally Abdominal:     General: Bowel sounds are normal.     Palpations: Abdomen is soft.     Tenderness: There is generalized abdominal tenderness. There is guarding. There is no right CVA tenderness, left CVA tenderness or rebound.     Comments: Significant tenderness to palpation  throughout abdomen with associated guarding.  Neurological:     Mental Status: He is alert.  Psychiatric:        Behavior: Behavior is cooperative.      UC Treatments / Results  Labs (all labs ordered are listed, but only abnormal results are displayed) Labs Reviewed - No data to display  EKG   Radiology  No results found.  Procedures Procedures (including critical care time)  Medications Ordered in UC Medications - No data to display  Initial Impression / Assessment and Plan / UC Course  I have reviewed the triage vital signs and the nursing notes.  Pertinent labs & imaging results that were available during my care of the patient were reviewed by me and considered in my medical decision making (see chart for details).     Patient is febrile and ill-appearing.  Given significant tenderness on exam with associated guarding recommended going to the emergency room for further evaluation and management given our limited capabilities in urgent care since we do not have CT capabilities.  Patient was agreeable and will go directly to [facility] ER.  Patient was stable at the time of discharge and safe for private transport.  Final Clinical Impressions(s) / UC Diagnoses   Final diagnoses:  Continuous severe abdominal pain  Fever, unspecified  Blood in stool  Abdominal guarding     Discharge Instructions      Since you are having a severe abdominal pain with fever and bloody stool I would like you to go to the emergency room for further evaluation and management as we discussed.     ED Prescriptions   None    PDMP not reviewed this encounter.   Sherrell Rocky POUR, PA-C 05/20/24 1251

## 2024-08-18 ENCOUNTER — Ambulatory Visit: Admitting: General Practice

## 2024-08-20 ENCOUNTER — Other Ambulatory Visit: Payer: Self-pay

## 2024-08-20 MED ORDER — COMIRNATY 30 MCG/0.3ML IM SUSY
0.3000 mL | PREFILLED_SYRINGE | Freq: Once | INTRAMUSCULAR | 0 refills | Status: AC
Start: 2024-08-20 — End: 2024-08-21
  Filled 2024-08-20: qty 0.3, 1d supply, fill #0

## 2024-08-20 MED ORDER — FLUZONE 0.5 ML IM SUSY
0.5000 mL | PREFILLED_SYRINGE | Freq: Once | INTRAMUSCULAR | 0 refills | Status: AC
  Filled 2024-08-20: qty 0.5, 1d supply, fill #0

## 2024-09-03 ENCOUNTER — Other Ambulatory Visit (HOSPITAL_COMMUNITY): Payer: Self-pay

## 2024-09-03 ENCOUNTER — Other Ambulatory Visit: Payer: Self-pay

## 2024-12-06 ENCOUNTER — Ambulatory Visit: Admission: RE | Admit: 2024-12-06 | Source: Home / Self Care

## 2024-12-06 ENCOUNTER — Encounter: Admission: RE | Payer: Self-pay | Source: Home / Self Care

## 2024-12-14 ENCOUNTER — Encounter: Admitting: Obstetrics & Gynecology
